# Patient Record
Sex: Female | Born: 1950
Health system: Southern US, Community
[De-identification: ages and names within clinical notes are randomized; demographics above are authoritative.]

## PROBLEM LIST (undated history)

## (undated) DIAGNOSIS — R921 Mammographic calcification found on diagnostic imaging of breast: Secondary | ICD-10-CM

## (undated) DIAGNOSIS — T7840XA Allergy, unspecified, initial encounter: Secondary | ICD-10-CM

## (undated) DIAGNOSIS — Z8489 Family history of other specified conditions: Secondary | ICD-10-CM

## (undated) DIAGNOSIS — J45909 Unspecified asthma, uncomplicated: Secondary | ICD-10-CM

## (undated) DIAGNOSIS — K760 Fatty (change of) liver, not elsewhere classified: Secondary | ICD-10-CM

## (undated) DIAGNOSIS — N941 Unspecified dyspareunia: Secondary | ICD-10-CM

## (undated) DIAGNOSIS — I209 Angina pectoris, unspecified: Secondary | ICD-10-CM

## (undated) DIAGNOSIS — H269 Unspecified cataract: Secondary | ICD-10-CM

## (undated) DIAGNOSIS — M1990 Unspecified osteoarthritis, unspecified site: Secondary | ICD-10-CM

## (undated) DIAGNOSIS — Z95820 Peripheral vascular angioplasty status with implants and grafts: Secondary | ICD-10-CM

## (undated) DIAGNOSIS — I2510 Atherosclerotic heart disease of native coronary artery without angina pectoris: Secondary | ICD-10-CM

## (undated) DIAGNOSIS — R011 Cardiac murmur, unspecified: Secondary | ICD-10-CM

## (undated) DIAGNOSIS — I10 Essential (primary) hypertension: Secondary | ICD-10-CM

## (undated) DIAGNOSIS — E785 Hyperlipidemia, unspecified: Secondary | ICD-10-CM

## (undated) DIAGNOSIS — T8859XA Other complications of anesthesia, initial encounter: Secondary | ICD-10-CM

## (undated) HISTORY — PX: FOOT SURGERY: SHX648

## (undated) HISTORY — PX: CARDIAC CATHETERIZATION: SHX172

## (undated) HISTORY — PX: BREAST EXCISIONAL BIOPSY: SUR124

---

## 1973-11-16 HISTORY — PX: ABDOMINAL HYSTERECTOMY: SHX81

## 1999-11-17 HISTORY — PX: BREAST LUMPECTOMY: SHX2

## 2000-02-23 ENCOUNTER — Encounter: Payer: Self-pay | Admitting: General Surgery

## 2000-02-23 ENCOUNTER — Ambulatory Visit (HOSPITAL_COMMUNITY): Admission: RE | Admit: 2000-02-23 | Discharge: 2000-02-23 | Payer: Self-pay | Admitting: General Surgery

## 2000-09-14 ENCOUNTER — Encounter: Payer: Self-pay | Admitting: General Surgery

## 2000-09-14 ENCOUNTER — Encounter: Admission: RE | Admit: 2000-09-14 | Discharge: 2000-09-14 | Payer: Self-pay | Admitting: General Surgery

## 2002-08-11 ENCOUNTER — Encounter: Admission: RE | Admit: 2002-08-11 | Discharge: 2002-08-11 | Payer: Self-pay | Admitting: General Surgery

## 2002-08-11 ENCOUNTER — Encounter: Payer: Self-pay | Admitting: General Surgery

## 2003-08-14 ENCOUNTER — Encounter: Payer: Self-pay | Admitting: General Surgery

## 2003-08-14 ENCOUNTER — Encounter: Admission: RE | Admit: 2003-08-14 | Discharge: 2003-08-14 | Payer: Self-pay | Admitting: General Surgery

## 2017-04-16 DIAGNOSIS — I2510 Atherosclerotic heart disease of native coronary artery without angina pectoris: Secondary | ICD-10-CM

## 2017-04-16 HISTORY — DX: Atherosclerotic heart disease of native coronary artery without angina pectoris: I25.10

## 2017-04-20 DIAGNOSIS — I2510 Atherosclerotic heart disease of native coronary artery without angina pectoris: Secondary | ICD-10-CM | POA: Insufficient documentation

## 2017-04-20 DIAGNOSIS — Z955 Presence of coronary angioplasty implant and graft: Secondary | ICD-10-CM | POA: Insufficient documentation

## 2017-04-20 HISTORY — PX: CORONARY ANGIOPLASTY WITH STENT PLACEMENT: SHX49

## 2017-04-20 HISTORY — PX: CARDIAC CATHETERIZATION: SHX172

## 2017-10-28 HISTORY — PX: OTHER SURGICAL HISTORY: SHX169

## 2018-11-16 HISTORY — PX: CATARACT EXTRACTION: SUR2

## 2021-05-27 DIAGNOSIS — E782 Mixed hyperlipidemia: Secondary | ICD-10-CM | POA: Insufficient documentation

## 2021-05-27 DIAGNOSIS — I10 Essential (primary) hypertension: Secondary | ICD-10-CM | POA: Insufficient documentation

## 2021-05-28 ENCOUNTER — Other Ambulatory Visit: Payer: Self-pay | Admitting: Internal Medicine

## 2021-05-28 DIAGNOSIS — Z1231 Encounter for screening mammogram for malignant neoplasm of breast: Secondary | ICD-10-CM

## 2021-05-28 DIAGNOSIS — R7989 Other specified abnormal findings of blood chemistry: Secondary | ICD-10-CM

## 2021-06-17 ENCOUNTER — Ambulatory Visit
Admission: RE | Admit: 2021-06-17 | Discharge: 2021-06-17 | Disposition: A | Discharge status: Home / Self Care | Payer: Managed Care, Other (non HMO) | Source: Ambulatory Visit | Attending: Internal Medicine | Admitting: Internal Medicine

## 2021-06-17 ENCOUNTER — Other Ambulatory Visit: Payer: Self-pay

## 2021-06-17 DIAGNOSIS — R7989 Other specified abnormal findings of blood chemistry: Secondary | ICD-10-CM | POA: Diagnosis not present

## 2021-06-25 ENCOUNTER — Other Ambulatory Visit: Payer: Self-pay

## 2021-06-25 ENCOUNTER — Encounter: Payer: Self-pay | Admitting: Physical Therapy

## 2021-06-25 ENCOUNTER — Ambulatory Visit: Payer: Medicare Other | Attending: Obstetrics and Gynecology | Admitting: Physical Therapy

## 2021-06-25 DIAGNOSIS — R102 Pelvic and perineal pain: Secondary | ICD-10-CM | POA: Diagnosis not present

## 2021-06-25 DIAGNOSIS — R293 Abnormal posture: Secondary | ICD-10-CM | POA: Diagnosis present

## 2021-06-25 DIAGNOSIS — R278 Other lack of coordination: Secondary | ICD-10-CM | POA: Diagnosis present

## 2021-06-25 NOTE — Therapy (Signed)
[facility] [address] [place], [place], [address] Phone: [phone]   Fax:  [phone]  Physical Therapy Evaluation  Patient Details  Name: Becky Mitchell MRN: [id number] Date of Birth: 1951-05-10 Referring Provider (PT): Christeen Douglas   Encounter Date: 06/25/2021   PT End of Session - 06/25/21 1822     Visit Number 1    Number of Visits 12    Date for PT Re-Evaluation 09/17/21    Authorization Type IE 06/25/2021    PT Start Time 1545    PT Stop Time 1625    PT Time Calculation (min) 40 min    Activity Tolerance Patient tolerated treatment well    Behavior During Therapy [facility] for tasks assessed/performed             History reviewed. No pertinent past medical history.  Past Surgical History:  Procedure Laterality Date   ABDOMINAL HYSTERECTOMY  1975   CARDIAC CATHETERIZATION  04/20/2017   spine cyst aspiration  10/28/2017    There were no vitals filed for this visit.     [facility] PT Assessment - 06/25/21 0001       Assessment   Medical Diagnosis Dyspareunia    Referring Provider (PT) Christeen Douglas    Hand Dominance Right      Balance Screen   Has the patient fallen in the past 6 months Yes    How many times? 1            PELVIC HEALTH PHYSICAL THERAPY EVALUATION  SCREENING Red Flags: None Have you had any night sweats? Unexplained weight loss? Saddle anesthesia? Unexplained changes in bowel or bladder habits?   SUBJECTIVE  Chief Complaint: Patient reports that she has not been able to participate in sex in years. Patient was told by GYN she had dyspareunia and was referred to dilator therapy. Patient notes that going through menopause she had continued tissue changes. Patient notes that she can count on 1 hand in 6 years how many times she has engaged in penetrative sex. All times have resulted in tearing and bleeding, always in the same spot (roughly 7 o'clock) which is very close to where the  episiotomy occurred. Patient notes that she has pain with penetration when an object is of equal to size 4 dilator.   Pertinent History:  Falls Positive.  Scoliosis Negative. Pulmonary disease/dysfunction Positive. Surgical history: Positive for see above.    Obstetrical History: G1P1 Deliveries: SVD Tearing/Episiotomy: episiotomy and tear; had repair surgery 25 years after which helped for a period of time.    Gynecological History: Hysterectomy: Yes Abdominal Endometriosis: Negative Pelvic Organ Prolapse: Negative Pain with exam: No Heaviness/pressure: No    Urinary History: Incontinence: Positive. Onset: since menopause. Triggers: sneezing, occasional cough. Only happens with full bladder. Amount: Min/Mod. Protective undergarments: No  Fluid Intake: 56+ oz H20, iced tea caffeinated, powerade zero Nocturia: 1-2x/night Frequency of urination: every 2-2.5 hours Pain with urination: Negative Difficulty initiating urination: Negative Intermittent stream: Positive for needs to tilt to one side to completely empty. Frequent UTI: Positive for historical.   Gastrointestinal History: [place] Stool Chart: Type 3 Frequency of BMs: 1x/day Pain with defecation: Negative Straining with defecation: Negative Hemorrhoids: Positive ; internal; latent  Sexual activity/pain: Pain with intercourse: Positive.   Initial penetration: Yes  Deep thrusting: Yes External stimulation: No Able to achieve orgasm: Yes  Location of pain: 7 o'clock episiotomy scar Current pain:  0/10  Max pain:  10/10 Least pain:  0/10 Pain quality: pain quality: excruciating, agonizing Radiating pain: No    Patient assessment of present state: I think scar tissue has built up over time between the original episiotomy and the repair.    OBJECTIVE  Mental Status Patient is oriented to person, place and time.  Recent memory is intact.  Remote memory is intact.  Attention span and concentration are  intact.  Expressive speech is intact.  Patient's fund of knowledge is within normal limits for educational level.  POSTURE/OBSERVATIONS:  Lumbar lordosis: decreased Thoracic kyphosis: increased Iliac crest height: L elevated  GAIT: Patient ambulates with significantly limited pelvic rotation, R Trendelenburg.   RANGE OF MOTION: deferred 2/2 to time constraints   LEFT RIGHT  Lumbar forward flexion (65):      Lumbar extension (30):     Lumbar lateral flexion (25):     Thoracic and Lumbar rotation (30 degrees):       Hip Flexion (0-125):      Hip IR (0-45):     Hip ER (0-45):     Hip Abduction (0-40):     Hip extension (0-15):       SENSATION: deferred 2/2 to time constraints  STRENGTH: MMT deferred 2/2 to time constraints  RLE LLE  Hip Flexion    Hip Extension    Hip Abduction     Hip Adduction     Hip ER     Hip IR     Knee Extension    Knee Flexion    Dorsiflexion     Plantarflexion (seated)     ABDOMINAL: deferred 2/2 to time constraints Palpation: Diastasis: Scar mobility: Rib flare:  SPECIAL TESTS: deferred 2/2 to time constraints   PHYSICAL PERFORMANCE MEASURES: STS:  Deep Squat: RLE STS: LLE STS:  : 5TSTS:    EXTERNAL PELVIC EXAM: deferred 2/2 to time constraints Palpation: Breath coordination: Voluntary Contraction: present/absent Relaxation: full/delayed/non-relaxing Perineal movement with sustained IAP increase ("bear down"): descent/no change/elevation/excessive descent Perineal movement with rapid IAP increase ("cough"): elevation/no change/descent  INTERNAL VAGINAL EXAM: deferred 2/2 to time constraints Introitus Appears:  Skin integrity:  Scar mobility: Strength (PERF):  Symmetry: Palpation: Prolapse:   INTERNAL RECTAL EXAM: not indicated Strength (PERF): Symmetry: Palpation: Prolapse:   OUTCOME MEASURES: FOTO (60)   ASSESSMENT Patient is a 70 year old presenting to clinic with chief complaints of pelvic pain.  Today's evaluation is suggestive of deficits in posture, pain, PFM coordination, PFM extensibility as evidenced by L elevated IC with R lateral trunk flexion, 10/10 pain with penetration, SUI with coughing/laughing. Patient's responses on FOTO Urinary outcome measures (60) indicate moderate functional limitations/disability/distress. Patient's progress may be limited due to persistence of complaint; however, patient's motivation is advantageous. Patient was able to achieve basic understanding of PFM functions during today's evaluation and responded positively to educational interventions. Patient will benefit from continued skilled therapeutic intervention to address deficits in posture, pain, PFM coordination, PFM extensibility in order to increase function and improve overall QOL.  EDUCATION Patient educated on prognosis, POC, and provided with HEP including: not initiated. Patient articulated understanding and returned demonstration. Patient will benefit from further education in order to maximize compliance and understanding for long-term therapeutic gains.  TREATMENT Neuromuscular Re-education: Patient educated on primary functions of the pelvic floor including: posture/balance, sexual pleasure, storage and elimination of waste from the body, abdominal cavity closure, and breath coordination.   Objective measurements completed on examination: See above findings.  PT Long Term Goals - 06/25/21 1842       PT LONG TERM GOAL #1   Title Patient will demonstrate independence with HEP in order to maximize therapeutic gains and improve carryover from physical therapy sessions to ADLs in the home and community.    Baseline IE: not initiated    Time 12    Period Weeks    Status New    Target Date 09/17/21      PT LONG TERM GOAL #2   Title Patient will demonstrate improved function as evidenced by a score of 68 on FOTO measure for full participation in activities  at home and in the community.    Baseline IE: 60    Time 12    Period Weeks    Status New    Target Date 09/17/21      PT LONG TERM GOAL #3   Title Patient will decrease worst pain as reported on NPRS by at least 2 points to demonstrate clinically significant reduction in pain in order to restore/improve function and overall QOL.    Baseline IE: 10/10 vaginal penetration    Time 12    Period Weeks    Status New    Target Date 09/17/21      PT LONG TERM GOAL #4   Title Patient will demonstrate coordinated lengthening and relaxation of PFM with diaphragmatic inhalation in order to decrease spasm and allow for unrestricted elimination of urine/feces for improved overall QOL.    Baseline IE: not demonstrated    Time 12    Period Weeks    Status New    Target Date 09/17/21                    Plan - 06/25/21 1823     Clinical Impression Statement Patient is a 70 year old presenting to clinic with chief complaints of pelvic pain. Today's evaluation is suggestive of deficits in posture, pain, PFM coordination, PFM extensibility as evidenced by L elevated IC with R lateral trunk flexion, 10/10 pain with penetration, SUI with coughing/laughing. Patient's responses on FOTO Urinary outcome measures (60) indicate moderate functional limitations/disability/distress. Patient's progress may be limited due to persistence of complaint; however, patient's motivation is advantageous. Patient was able to achieve basic understanding of PFM functions during today's evaluation and responded positively to educational interventions. Patient will benefit from continued skilled therapeutic intervention to address deficits in posture, pain, PFM coordination, PFM extensibility in order to increase function and improve overall QOL.    Personal Factors and Comorbidities Comorbidity 3+;Behavior Pattern;Time since onset of injury/illness/exacerbation;Past/Current Experience    Comorbidities asthma, s/p  hysterectomy (1976), CAD with stent placed 2018, essential HTN, s/p appendectomy, arthritis    Examination-Activity Limitations Other    Examination-Participation Restrictions Interpersonal Relationship    Stability/Clinical Decision Making Evolving/Moderate complexity    Clinical Decision Making Moderate    Rehab Potential Good    PT Frequency 1x / week    PT Duration 12 weeks    PT Treatment/Interventions Cryotherapy;Electrical Stimulation;Moist Heat;Therapeutic exercise;Neuromuscular re-education;Patient/family education;Orthotic Fit/Training;Manual techniques;Scar mobilization;Taping;Dry needling    PT Next Visit Plan physical assessment    PT Home Exercise Plan not initiated    Consulted and Agree with Plan of Care Patient             Patient will benefit from skilled therapeutic intervention in order to improve the following deficits and impairments:  Pain, Postural dysfunction, Improper body mechanics, Increased muscle spasms, Decreased strength, Decreased coordination,  Decreased activity tolerance, Increased fascial restricitons  Visit Diagnosis: Pelvic pain  Other lack of coordination  Abnormal posture     Problem List There are no problems to display for this patient.   Sheria Lang PT, DPT [phone]  06/25/2021, 6:44 PM  [facility] [address] [place], [place], [address] Phone: [phone]   Fax:  [phone]  Name: Becky Mitchell MRN: [id number] Date of Birth: September 12, 1951

## 2021-07-02 ENCOUNTER — Ambulatory Visit: Payer: Medicare Other | Admitting: Physical Therapy

## 2021-07-03 ENCOUNTER — Ambulatory Visit: Payer: Medicare Other | Admitting: Physical Therapy

## 2021-07-03 ENCOUNTER — Other Ambulatory Visit: Payer: Self-pay

## 2021-07-03 ENCOUNTER — Encounter: Payer: Self-pay | Admitting: Physical Therapy

## 2021-07-03 DIAGNOSIS — R278 Other lack of coordination: Secondary | ICD-10-CM

## 2021-07-03 DIAGNOSIS — R102 Pelvic and perineal pain: Secondary | ICD-10-CM | POA: Diagnosis not present

## 2021-07-03 DIAGNOSIS — R293 Abnormal posture: Secondary | ICD-10-CM

## 2021-07-03 NOTE — Therapy (Addendum)
[facility] [address] [place], [place], [address] Phone: [phone]   Fax:  [phone]  Physical Therapy Treatment  Patient Details  Name: Becky Mitchell MRN: [id number] Date of Birth: 06-Feb-1951 Referring Provider (PT): Christeen Douglas   Encounter Date: 07/03/2021   PT End of Session - 07/03/21 1438     Visit Number 2    Number of Visits 12    Date for PT Re-Evaluation 09/17/21    PT Start Time 1330    PT Stop Time 1415    PT Time Calculation (min) 45 min    Activity Tolerance Patient tolerated treatment well    Behavior During Therapy [facility] for tasks assessed/performed             History reviewed. No pertinent past medical history.  Past Surgical History:  Procedure Laterality Date   ABDOMINAL HYSTERECTOMY  1975   CARDIAC CATHETERIZATION  04/20/2017   spine cyst aspiration  10/28/2017    There were no vitals filed for this visit.   Subjective Assessment - 07/03/21 1327     Subjective Patient has no changes from last session and has began reading her book she received with the dilators purchased.    Currently in Pain? No/denies            TREATMENT Pre treatment Assessment:  POSTURE: IC elevation: negative Pelvic obliquity: negative  RANGE OF MOTION:    LEFT RIGHT  Lumbar forward flexion (65):  WFL    Lumbar extension (30): WFL    Lumbar lateral flexion (25):  [facility]  Thoracic and Lumbar rotation (30 degrees):    [facility] WFL  Hip Flexion (0-125):   [facility] WFL  Hip IR (0-45):  [facility] WFL  Hip ER (0-45):  [facility] WFL  Hip Abduction (0-40):  WFL WFL    SENSATION: Appears intact with light touch B  STRENGTH: MMT   RLE LLE  Hip Flexion 4 4  Hip Extension 4 4  Hip Abduction  4 4  Hip Adduction  4 4  Knee Extension 4 4  Knee Flexion 4 4  Dorsiflexion  4 4  Plantarflexion (seated) 4 4   ABDOMINAL:  Diastasis: less than 1 finger width superior to umbilicus, 2.5 finger width present at umbilicus and  inferior   SPECIAL TESTS:  FABER: negative FADDIR: positive, B  EXTERNAL PELVIC EXAM:  Breath coordination: present minimally, inconsistent Voluntary Contraction: present, 3/5 Relaxation: present, delayed Perineal movement with sustained IAP increase ("bear down"): elevation Perineal movement with rapid IAP increase ("cough"): no change  Neuromuscular Re-education: Supine diaphragmatic breathing with VCs and TCs for downregulation of the nervous system and management of IAP.   Patient educated on PFM lengthening, HEP, and what to expect during course of physical therapy.   Patient response: Patient verbalized difficulty coordinating breath with PFM lengthening but happily agreed to practice at home.   ASSESSMENT Patient presents to clinic with excellent motivation to participate in today's session. Patient demonstrates deficits in posture, pain, PFM coordination, PFM extensibility, and IAP management. During physical assessment, lumbar extension provoked pain and + FADDIR B. External pelvic assessment revealed ascending perineal movement with VC to "bear down." Patient educated on typical coordination of the PFM and responded positively. Patient will continue to benefit from skilled therapeutic interventions in order to address deficits of posture, pain, PFM coordination, PFM extensibility, and IAP management in order to return to function and improve QOL.    PT Long Term Goals - 06/25/21  1842       PT LONG TERM GOAL #1   Title Patient will demonstrate independence with HEP in order to maximize therapeutic gains and improve carryover from physical therapy sessions to ADLs in the home and community.    Baseline IE: not initiated    Time 12    Period Weeks    Status New    Target Date 09/17/21      PT LONG TERM GOAL #2   Title Patient will demonstrate improved function as evidenced by a score of 68 on FOTO measure for full participation in activities at home and in the community.     Baseline IE: 60    Time 12    Period Weeks    Status New    Target Date 09/17/21      PT LONG TERM GOAL #3   Title Patient will decrease worst pain as reported on NPRS by at least 2 points to demonstrate clinically significant reduction in pain in order to restore/improve function and overall QOL.    Baseline IE: 10/10 vaginal penetration    Time 12    Period Weeks    Status New    Target Date 09/17/21      PT LONG TERM GOAL #4   Title Patient will demonstrate coordinated lengthening and relaxation of PFM with diaphragmatic inhalation in order to decrease spasm and allow for unrestricted elimination of urine/feces for improved overall QOL.    Baseline IE: not demonstrated    Time 12    Period Weeks    Status New    Target Date 09/17/21                 Plan - 07/03/21 1321     Clinical Impression Statement Patient presents to clinic with excellent motivation to participate in today's session. Patient demonstrates deficits in posture, pain, PFM coordination, PFM extensibility, and IAP management. During physical assessment, lumbar extension provoked pain and + FADDIR B. External pelvic assessment revealed ascending perineal movement with VC to "bear down." Patient educated on typical coordination of the PFM and responded positively. Patient will continue to benefit from skilled therapeutic interventions in order to address deficits of posture, pain, PFM coordination, PFM extensibility, and IAP management in order to return to function and improve QOL.    Personal Factors and Comorbidities Comorbidity 3+;Behavior Pattern;Time since onset of injury/illness/exacerbation;Past/Current Experience    Comorbidities asthma, s/p hysterectomy (1976), CAD with stent placed 2018, essential HTN, s/p appendectomy, arthritis    Examination-Activity Limitations Other    Examination-Participation Restrictions Interpersonal Relationship    Stability/Clinical Decision Making Evolving/Moderate  complexity    Rehab Potential Good    PT Frequency 1x / week    PT Duration 12 weeks    PT Treatment/Interventions Cryotherapy;Electrical Stimulation;Moist Heat;Therapeutic exercise;Neuromuscular re-education;Patient/family education;Orthotic Fit/Training;Manual techniques;Scar mobilization;Taping;Dry needling    PT Next Visit Plan coordinated breath, PFM lengthening exercises    PT Home Exercise Plan Access Code: [email or web address]    Consulted and Agree with Plan of Care Patient             Patient will benefit from skilled therapeutic intervention in order to improve the following deficits and impairments:  Pain, Postural dysfunction, Improper body mechanics, Increased muscle spasms, Decreased strength, Decreased coordination, Decreased activity tolerance, Increased fascial restricitons  Visit Diagnosis: Pelvic pain  Other lack of coordination  Abnormal posture     Problem List There are no problems to display for this patient.  Gurnie Duris, SPT  This entire session was performed under direct supervision and direction of a licensed therapist/therapist assistant . I have personally read, edited and approve of the note as written.  Sheria Lang PT, DPT [phone]  07/03/2021, 3:34 PM  [place] [facility] [address] [place], [place], [address] Phone: [phone]   Fax:  [phone]  Name: Becky Mitchell MRN: [id number] Date of Birth: 06/24/1951

## 2021-07-10 ENCOUNTER — Ambulatory Visit: Payer: Medicare Other | Admitting: Physical Therapy

## 2021-07-10 ENCOUNTER — Other Ambulatory Visit: Payer: Self-pay

## 2021-07-10 ENCOUNTER — Encounter: Payer: Self-pay | Admitting: Physical Therapy

## 2021-07-10 DIAGNOSIS — R293 Abnormal posture: Secondary | ICD-10-CM

## 2021-07-10 DIAGNOSIS — R102 Pelvic and perineal pain: Secondary | ICD-10-CM

## 2021-07-10 DIAGNOSIS — R278 Other lack of coordination: Secondary | ICD-10-CM

## 2021-07-10 NOTE — Therapy (Signed)
[place] [facility] [address] [place], [place], [address] Phone: [phone]   Fax:  [phone]  Physical Therapy Treatment  Patient Details  Name: Becky Mitchell MRN: [id number] Date of Birth: 17-Apr-1951 Referring Provider (PT): Christeen Douglas   Encounter Date: 07/10/2021   PT End of Session - 07/10/21 1022     Visit Number 3    Number of Visits 12    Date for PT Re-Evaluation 09/17/21    Authorization Type IE 06/25/2021    PT Start Time 1027    PT Stop Time 1107    PT Time Calculation (min) 40 min    Activity Tolerance Patient tolerated treatment well    Behavior During Therapy [facility] for tasks assessed/performed             History reviewed. No pertinent past medical history.  Past Surgical History:  Procedure Laterality Date   ABDOMINAL HYSTERECTOMY  1975   CARDIAC CATHETERIZATION  04/20/2017   spine cyst aspiration  10/28/2017    There were no vitals filed for this visit.   Subjective Assessment - 07/10/21 1022     Subjective Patient has been able to perform breathing exercises since last session and has felt the PFM lengthening. Patient has not been able to use dilators quite yet.    Currently in Pain? No/denies            TREATMENT  Neuromuscular Re-education: Supine hooklying diaphragmatic breathing with VCs and TCs for downregulation of nervous system and IAP management   Supine PFM lengthening activities with visual cues on inhalation for improved perineal descent   Supine butterfly pose, verbal cues as needed   Supine alternating single knee to chest, verbal cues as needed   Supine "happy baby" pose, verbal cues as needed   Prone "childs pose," verbal cues as needed for shoulder and hip positioning and comfort  Patient educated throughout session on appropriate technique and form using multi-modal cueing, added activities to HEP, and activity modification. Patient verbalized understanding and returned  demonstration.  Patient Response to interventions: Patient reports difficulty concentrating on lengthening PFM in "happy baby" pose as it reminds her of sexual positions that cause pain. Patient agreeable to continue working on at home.   ASSESSMENT Patient presents to clinic with excellent motivation to participate in today's session. Patient demonstrates deficits in posture, pain, PFM coordination, PFM extensibility, and IAP management. Patient performed PFM lengthening activities with emphasis on inhalation in order to address deficit of perineal movement ascent with cue to "bear down." Patient able to achieve understanding of PFM extensibility and demonstrated use of a visual cue to assist with nervous system downtraining during today's session and responded positively to active interventions. Patient will benefit from continued skilled physical therapy to address remaining deficits in posture, pain, PFM coordination, PFM extensibility, and IAP management  in order to increase prior level of function and increase overall QOL.      PT Long Term Goals - 06/25/21 1842       PT LONG TERM GOAL #1   Title Patient will demonstrate independence with HEP in order to maximize therapeutic gains and improve carryover from physical therapy sessions to ADLs in the home and community.    Baseline IE: not initiated    Time 12    Period Weeks    Status New    Target Date 09/17/21      PT LONG TERM GOAL #2   Title Patient will demonstrate improved function  as evidenced by a score of 68 on FOTO measure for full participation in activities at home and in the community.    Baseline IE: 60    Time 12    Period Weeks    Status New    Target Date 09/17/21      PT LONG TERM GOAL #3   Title Patient will decrease worst pain as reported on NPRS by at least 2 points to demonstrate clinically significant reduction in pain in order to restore/improve function and overall QOL.    Baseline IE: 10/10 vaginal  penetration    Time 12    Period Weeks    Status New    Target Date 09/17/21      PT LONG TERM GOAL #4   Title Patient will demonstrate coordinated lengthening and relaxation of PFM with diaphragmatic inhalation in order to decrease spasm and allow for unrestricted elimination of urine/feces for improved overall QOL.    Baseline IE: not demonstrated    Time 12    Period Weeks    Status New    Target Date 09/17/21                   Plan - 07/10/21 1023     Clinical Impression Statement Patient presents to clinic with excellent motivation to participate in today's session. Patient demonstrates deficits in posture, pain, PFM coordination, PFM extensibility, and IAP management. Patient performed PFM lengthening activities with emphasis on inhalation in order to address deficit of perineal movement ascent with cue to "bear down." Patient able to achieve understanding of PFM extensibility and demonstrated use of a visual cue to assist with nervous system downtraining during today's session and responded positively to active interventions. Patient will benefit from continued skilled physical therapy to address remaining deficits in posture, pain, PFM coordination, PFM extensibility, and IAP management  in order to increase prior level of function and increase overall QOL.    Personal Factors and Comorbidities Comorbidity 3+;Behavior Pattern;Time since onset of injury/illness/exacerbation;Past/Current Experience    Comorbidities asthma, s/p hysterectomy (1976), CAD with stent placed 2018, essential HTN, s/p appendectomy, arthritis    Examination-Activity Limitations Other    Examination-Participation Restrictions Interpersonal Relationship    Stability/Clinical Decision Making Evolving/Moderate complexity    Rehab Potential Good    PT Frequency 1x / week    PT Duration 12 weeks    PT Treatment/Interventions Cryotherapy;Electrical Stimulation;Moist Heat;Therapeutic exercise;Neuromuscular  re-education;Patient/family education;Orthotic Fit/Training;Manual techniques;Scar mobilization;Taping;Dry needling    PT Next Visit Plan begin deep core activation with coordinated breath    PT Home Exercise Plan Access Code: [email or web address]    Consulted and Agree with Plan of Care Patient             Patient will benefit from skilled therapeutic intervention in order to improve the following deficits and impairments:  Pain, Postural dysfunction, Improper body mechanics, Increased muscle spasms, Decreased strength, Decreased coordination, Decreased activity tolerance, Increased fascial restricitons  Visit Diagnosis: Pelvic pain  Other lack of coordination  Abnormal posture     Problem List There are no problems to display for this patient.   Asael Pann, SPT  This entire session was performed under direct supervision and direction of a licensed Estate agent . I have personally read, edited and approve of the note as written.  Sheria Lang PT, DPT [phone]  07/10/2021, 12:34 PM  [place] [facility] [address] [place], [place], [address] Phone: [phone]   Fax:  [phone]  Name: Becky Mitchell MRN: [id number] Date of Birth: 03/15/1951

## 2021-07-16 ENCOUNTER — Encounter: Payer: Self-pay | Admitting: Physical Therapy

## 2021-07-17 ENCOUNTER — Encounter: Payer: Self-pay | Admitting: Physical Therapy

## 2021-07-17 ENCOUNTER — Other Ambulatory Visit: Payer: Self-pay

## 2021-07-17 ENCOUNTER — Ambulatory Visit: Payer: Medicare Other | Attending: Obstetrics and Gynecology | Admitting: Physical Therapy

## 2021-07-17 DIAGNOSIS — R278 Other lack of coordination: Secondary | ICD-10-CM | POA: Diagnosis present

## 2021-07-17 DIAGNOSIS — R102 Pelvic and perineal pain: Secondary | ICD-10-CM | POA: Insufficient documentation

## 2021-07-17 DIAGNOSIS — R293 Abnormal posture: Secondary | ICD-10-CM

## 2021-07-17 NOTE — Therapy (Signed)
[place] [facility] [address] [place], [place], [address] Phone: [phone]   Fax:  [phone]  Physical Therapy Treatment  Patient Details  Name: Becky Mitchell MRN: [id number] Date of Birth: 02-Jun-1951 Referring Provider (PT): Christeen Douglas   Encounter Date: 07/17/2021   PT End of Session - 07/17/21 1110     Visit Number 4    Number of Visits 12    Date for PT Re-Evaluation 09/17/21    Authorization Type IE 06/25/2021    PT Start Time 1115    PT Stop Time 1155    PT Time Calculation (min) 40 min    Activity Tolerance Patient tolerated treatment well    Behavior During Therapy [facility] for tasks assessed/performed             History reviewed. No pertinent past medical history.  Past Surgical History:  Procedure Laterality Date   ABDOMINAL HYSTERECTOMY  1975   CARDIAC CATHETERIZATION  04/20/2017   spine cyst aspiration  10/28/2017    There were no vitals filed for this visit.   Subjective Assessment - 07/17/21 1110     Subjective Patient has no significant changes since last session. Patient has been able to practice PFM lengthening exercises at home and been able to feel the tension release.    Currently in Pain? No/denies            TREATMENT  Pre-treatment assessment:  INTERNAL VAGINAL EXAM: Patient educated on the purpose of the pelvic exam and articulated understanding; patient consented to the exam verbally. Introitus Appears: WNL Skin integrity: slight erythema on labia majora, B Palpation: no TTP at bulbo or ischiocavernosus mm, no TTP at pubic symphysis; increased pain with initial DIP insertion at vaginal opening, tenderness at posterior fourchette, tenderness on R superficial transverse perineal mm  Neuromuscular Re-education: Supine hooklying diaphragmatic breathing before and during assessment with VCs and TCs for downregulation of nervous system and IAP management  Patient educated on pain modulation  via ice/heat after manual techniques.  Manual Therapy: Supine hooklying MFR techniques at posterior fourchette and surrounding connective tissue at superficial transverse, bulbocavernous, and ischiocavernosus mm to improve tissue extensibility and decrease tension at the pelvic floor.    Patient educated throughout session on appropriate technique and form using multi-modal cueing, added activities to HEP, and activity modification. Patient verbalized understanding and returned demonstration.  Patient Response to Interventions Patient with sensitivity at the vaginal opening.   ASSESSMENT Patient presents to clinic with excellent motivation to participate in today's session. Patient demonstrates deficits in posture, pain, PFM coordination, PFM extensibility, and IAP management. PFM internal assessment revealed increased pain at vaginal opening with DIP insertion. Patient unable to tolerate further internal assessment and treatment focused on MFR techniques at posterior fourchette and surrounding connective tissue to ease pain and promote tissue extensibility. Patient performed excellent diaphragmatic breathing techniques during today's session and responded positively to manual interventions. Patient will benefit from continued skilled physical therapy to address remaining deficits in posture, pain, PFM coordination, PFM extensibility, and IAP management  in order to increase prior level of function and increase overall QOL.      PT Long Term Goals - 06/25/21 1842       PT LONG TERM GOAL #1   Title Patient will demonstrate independence with HEP in order to maximize therapeutic gains and improve carryover from physical therapy sessions to ADLs in the home and community.    Baseline IE: not initiated    Time  12    Period Weeks    Status New    Target Date 09/17/21      PT LONG TERM GOAL #2   Title Patient will demonstrate improved function as evidenced by a score of 68 on FOTO measure for  full participation in activities at home and in the community.    Baseline IE: 60    Time 12    Period Weeks    Status New    Target Date 09/17/21      PT LONG TERM GOAL #3   Title Patient will decrease worst pain as reported on NPRS by at least 2 points to demonstrate clinically significant reduction in pain in order to restore/improve function and overall QOL.    Baseline IE: 10/10 vaginal penetration    Time 12    Period Weeks    Status New    Target Date 09/17/21      PT LONG TERM GOAL #4   Title Patient will demonstrate coordinated lengthening and relaxation of PFM with diaphragmatic inhalation in order to decrease spasm and allow for unrestricted elimination of urine/feces for improved overall QOL.    Baseline IE: not demonstrated    Time 12    Period Weeks    Status New    Target Date 09/17/21                   Plan - 07/17/21 1111     Clinical Impression Statement Patient presents to clinic with excellent motivation to participate in today's session. Patient demonstrates deficits in posture, pain, PFM coordination, PFM extensibility, and IAP management. PFM internal assessment revealed increased pain at vaginal opening with DIP insertion. Patient unable to tolerate further internal assessment and treatment focused on MFR techniques at posterior fourchette and surrounding connective tissue to ease pain and promote tissue extensibility. Patient performed excellent diaphragmatic breathing techniques during today's session and responded positively to manual interventions. Patient will benefit from continued skilled physical therapy to address remaining deficits in posture, pain, PFM coordination, PFM extensibility, and IAP management  in order to increase prior level of function and increase overall QOL.    Personal Factors and Comorbidities Comorbidity 3+;Behavior Pattern;Time since onset of injury/illness/exacerbation;Past/Current Experience    Comorbidities asthma, s/p  hysterectomy (1976), CAD with stent placed 2018, essential HTN, s/p appendectomy, arthritis    Examination-Activity Limitations Other    Examination-Participation Restrictions Interpersonal Relationship    Stability/Clinical Decision Making Evolving/Moderate complexity    Rehab Potential Good    PT Frequency 1x / week    PT Duration 12 weeks    PT Treatment/Interventions Cryotherapy;Electrical Stimulation;Moist Heat;Therapeutic exercise;Neuromuscular re-education;Patient/family education;Orthotic Fit/Training;Manual techniques;Scar mobilization;Taping;Dry needling    PT Next Visit Plan MFR of PFM, scar mobilization    PT Home Exercise Plan Access Code: [email or web address]    Consulted and Agree with Plan of Care Patient             Patient will benefit from skilled therapeutic intervention in order to improve the following deficits and impairments:  Pain, Postural dysfunction, Improper body mechanics, Increased muscle spasms, Decreased strength, Decreased coordination, Decreased activity tolerance, Increased fascial restricitons  Visit Diagnosis: Pelvic pain  Other lack of coordination  Abnormal posture     Problem List There are no problems to display for this patient.   Verlene Glantz, SPT  This entire session was performed under direct supervision and direction of a licensed Estate agent . I have personally read, edited and approve of the note as  written. Sheria Lang PT, DPT [phone]  07/17/2021, 1:41 PM  [facility] [address] [place], [place], [address] Phone: [phone]   Fax:  [phone]  Name: Becky Mitchell MRN: [id number] Date of Birth: Oct 19, 1951

## 2021-07-23 ENCOUNTER — Encounter: Payer: Self-pay | Admitting: Physical Therapy

## 2021-07-24 ENCOUNTER — Encounter: Payer: Self-pay | Admitting: Physical Therapy

## 2021-07-24 ENCOUNTER — Ambulatory Visit: Payer: Medicare Other | Admitting: Physical Therapy

## 2021-07-24 ENCOUNTER — Other Ambulatory Visit: Payer: Self-pay

## 2021-07-24 DIAGNOSIS — R278 Other lack of coordination: Secondary | ICD-10-CM

## 2021-07-24 DIAGNOSIS — R293 Abnormal posture: Secondary | ICD-10-CM

## 2021-07-24 DIAGNOSIS — R102 Pelvic and perineal pain: Secondary | ICD-10-CM

## 2021-07-24 NOTE — Therapy (Signed)
[place] [facility] [address] [place], [place], [address] Phone: [phone]   Fax:  [phone]  Physical Therapy Treatment  Patient Details  Name: Becky Mitchell MRN: [id number] Date of Birth: 25-Jun-1951 Referring Provider (PT): Benjaman Kindler   Encounter Date: 07/24/2021   PT End of Session - 07/24/21 1321     Visit Number 5    Number of Visits 12    Date for PT Re-Evaluation 09/17/21    Authorization Type IE 06/25/2021    PT Start Time 1325    PT Stop Time 1410    PT Time Calculation (min) 45 min    Activity Tolerance Patient tolerated treatment well    Behavior During Therapy [facility] for tasks assessed/performed             History reviewed. No pertinent past medical history.  Past Surgical History:  Procedure Laterality Date   ABDOMINAL HYSTERECTOMY  1975   CARDIAC CATHETERIZATION  04/20/2017   spine cyst aspiration  10/28/2017    There were no vitals filed for this visit.   Subjective Assessment - 07/24/21 1321     Subjective Patient is a bit moe fatigued today as her dog kept her up last night. Patient states trying a size 2 in her dilator kit and held it in the vaginal canal for 8 mins. Patient began to feel the "cramping" of her muscles but was able to incorporate diaphragmatic breathing with the dilator inserted. Patient did not have the feeling of tearing with the dilator that she usually feels with penetrative sex. Patient had no soreness from manual interventions last session.    Currently in Pain? No/denies            TREATMENT  Manual Therapy: Scar mobilization at abdominal hysterectomy scar: parallel and perpendicular motions to improve tissue extensibility and decrease tension at scar site.   Neuromuscular Re-education: Supine hooklying diaphragmatic breathing with VCs and TCs for downregulation of nervous system and IAP management   Supine PFM lengthening activities at wall with pillow for increased  tactile feedback   Supine butterfly pose with coordinated breath, VCs and TCs as needed   Supine hamstring stretch with coordinated breath, VCs and TCs as needed  Supine adductor "V" stretch with coordinated breath, VCs and TCs as needed  Supine "happy baby" pose with one leg straight and coordinated breath, VCs and TCs as needed  Supine "happy baby" pose with coordinated breath, VCs and TCs as needed   Patient educated on dilator use and prescription of introducing dilator insertion gently at the vagina. Patient also educated throughout session on appropriate technique and form using multi-modal cueing, added activities to HEP, and activity modification. Patient verbalized understanding and returned demonstration.  Patient Response to interventions: Patient notes it is easier for her body to relax and perform stretches in clinic than at home  ASSESSMENT Patient presents to clinic with excellent motivation to participate in today's session. Patient demonstrates deficits in posture, pain, PFM coordination, PFM extensibility, and scar mobility. Patient with increased tenderness, shelfing, and minimal erythema at abdominal hysterectomy scar with manual therapy performed at the deeper tissues in a parallel and perpendicular fashion. Patient performed PFM lengthening activities at the wall for increased feedback and increased stretch of LE musculature. Patient demonstrated improved diaphragmatic breathing in supine and responded positively to manual, active, and educational interventions. Patient able to achieve understanding of self scar massage and dilator use during today's session and will benefit from continued skilled physical  therapy to address remaining deficits in posture, pain, PFM coordination, PFM extensibility, and scar mobility in order to increase prior level of function and increase overall QOL.      PT Long Term Goals - 06/25/21 1842       PT LONG TERM GOAL #1   Title Patient will  demonstrate independence with HEP in order to maximize therapeutic gains and improve carryover from physical therapy sessions to ADLs in the home and community.    Baseline IE: not initiated    Time 12    Period Weeks    Status New    Target Date 09/17/21      PT LONG TERM GOAL #2   Title Patient will demonstrate improved function as evidenced by a score of 68 on FOTO measure for full participation in activities at home and in the community.    Baseline IE: 60    Time 12    Period Weeks    Status New    Target Date 09/17/21      PT LONG TERM GOAL #3   Title Patient will decrease worst pain as reported on NPRS by at least 2 points to demonstrate clinically significant reduction in pain in order to restore/improve function and overall QOL.    Baseline IE: 10/10 vaginal penetration    Time 12    Period Weeks    Status New    Target Date 09/17/21      PT LONG TERM GOAL #4   Title Patient will demonstrate coordinated lengthening and relaxation of PFM with diaphragmatic inhalation in order to decrease spasm and allow for unrestricted elimination of urine/feces for improved overall QOL.    Baseline IE: not demonstrated    Time 12    Period Weeks    Status New    Target Date 09/17/21                   Plan - 07/24/21 1321     Clinical Impression Statement Patient presents to clinic with excellent motivation to participate in today's session. Patient demonstrates deficits in posture, pain, PFM coordination, PFM extensibility, and scar mobility. Patient with increased tenderness, shelfing, and minimal erythema at abdominal hysterectomy scar with manual therapy performed at the deeper tissues in a parallel and perpendicular fashion. Patient performed PFM lengthening activities at the wall for increased feedback and increased stretch of LE musculature. Patient demonstrated improved diaphragmatic breathing in supine and responded positively to manual, active, and educational  interventions. Patient able to achieve understanding of self scar massage and dilator use during today's session and will benefit from continued skilled physical therapy to address remaining deficits in posture, pain, PFM coordination, PFM extensibility, and scar mobility in order to increase prior level of function and increase overall QOL.    Personal Factors and Comorbidities Comorbidity 3+;Behavior Pattern;Time since onset of injury/illness/exacerbation;Past/Current Experience    Comorbidities asthma, s/p hysterectomy (1976), CAD with stent placed 2018, essential HTN, s/p appendectomy, arthritis    Examination-Activity Limitations Other    Examination-Participation Restrictions Interpersonal Relationship    Stability/Clinical Decision Making Evolving/Moderate complexity    Rehab Potential Good    PT Frequency 1x / week    PT Duration 12 weeks    PT Treatment/Interventions Cryotherapy;Electrical Stimulation;Moist Heat;Therapeutic exercise;Neuromuscular re-education;Patient/family education;Orthotic Fit/Training;Manual techniques;Scar mobilization;Taping;Dry needling    PT Next Visit Plan MFR of PFM, scar mobilization    PT Home Exercise Plan Access Code: [email or web address]    Consulted and Agree with Plan of Care Patient  Patient will benefit from skilled therapeutic intervention in order to improve the following deficits and impairments:  Pain, Postural dysfunction, Improper body mechanics, Increased muscle spasms, Decreased strength, Decreased coordination, Decreased activity tolerance, Increased fascial restricitons  Visit Diagnosis: Pelvic pain  Other lack of coordination  Abnormal posture     Problem List There are no problems to display for this patient.  Forrest Demuro, SPT  This entire session was performed under direct supervision and direction of a licensed Chiropractor . I have personally read, edited and approve of the note as written.  Myles Gip PT, DPT [phone]  07/24/2021, 2:40 PM  [place] [facility] [address] [place], [place], [address] Phone: [phone]   Fax:  [phone]  Name: Becky Mitchell MRN: [id number] Date of Birth: 01/21/1951

## 2021-07-30 ENCOUNTER — Encounter: Payer: Self-pay | Admitting: Physical Therapy

## 2021-07-31 ENCOUNTER — Other Ambulatory Visit: Payer: Self-pay

## 2021-07-31 ENCOUNTER — Encounter: Payer: Self-pay | Admitting: Physical Therapy

## 2021-07-31 ENCOUNTER — Ambulatory Visit: Payer: Medicare Other | Admitting: Physical Therapy

## 2021-07-31 DIAGNOSIS — R278 Other lack of coordination: Secondary | ICD-10-CM

## 2021-07-31 DIAGNOSIS — R102 Pelvic and perineal pain: Secondary | ICD-10-CM

## 2021-07-31 DIAGNOSIS — R293 Abnormal posture: Secondary | ICD-10-CM

## 2021-07-31 NOTE — Therapy (Signed)
[facility] [facility] [address] [place], [place], [address] Phone: [phone]   Fax:  [phone]  Patient Details  Name: Becky Mitchell MRN: [id number] Date of Birth: 1951-08-15 Referring Provider:  Christeen Douglas, MD  Encounter Date: 07/31/2021   Subjective Assessment - 07/31/21 1329     Subjective Patient with increased stress due to family events over the past week and having to make end-of-life decisions for her pet.    Currently in Pain? No/denies            Patient and PT agreed to defer treatment session today due to increased emotional lability and stress. Patient comfortable to follow-up at next appointment.     Marke Goodwyn,SPT This entire session was performed under direct supervision and direction of a licensed Estate agent . I have personally read, edited and approve of the note as written.   Sheria Lang PT, DPT [phone]  07/31/2021, 1:50 PM  [facility] [address] [place], [place], [address] Phone: [phone]   Fax:  [phone]

## 2021-08-06 ENCOUNTER — Encounter: Payer: Self-pay | Admitting: Physical Therapy

## 2021-08-07 ENCOUNTER — Encounter: Payer: Self-pay | Admitting: Physical Therapy

## 2021-08-07 ENCOUNTER — Other Ambulatory Visit: Payer: Self-pay

## 2021-08-07 ENCOUNTER — Ambulatory Visit: Payer: Medicare Other | Admitting: Physical Therapy

## 2021-08-07 DIAGNOSIS — R293 Abnormal posture: Secondary | ICD-10-CM

## 2021-08-07 DIAGNOSIS — R102 Pelvic and perineal pain: Secondary | ICD-10-CM

## 2021-08-07 DIAGNOSIS — R278 Other lack of coordination: Secondary | ICD-10-CM

## 2021-08-07 NOTE — Therapy (Signed)
[facility] [address] [place], [place], [address] Phone: [phone]   Fax:  [phone]  Physical Therapy Treatment  Patient Details  Name: Becky Mitchell MRN: [id number] Date of Birth: Oct 10, 1951 Referring Provider (PT): Christeen Douglas   Encounter Date: 08/07/2021   PT End of Session - 08/07/21 1329     Visit Number 7    Number of Visits 12    Date for PT Re-Evaluation 09/17/21    Authorization Type IE 06/25/2021    PT Start Time 1330    PT Stop Time 1410    PT Time Calculation (min) 40 min    Activity Tolerance Patient tolerated treatment well    Behavior During Therapy [facility] for tasks assessed/performed             History reviewed. No pertinent past medical history.  Past Surgical History:  Procedure Laterality Date   ABDOMINAL HYSTERECTOMY  1975   CARDIAC CATHETERIZATION  04/20/2017   spine cyst aspiration  10/28/2017    There were no vitals filed for this visit.   Subjective Assessment - 08/07/21 1328     Subjective Patient reports doing better than last week but is in a greiving process right now. Patient has not had the concentration to perform her HEP.    Currently in Pain? No/denies            TREATMENT  Neuromuscular Re-education: Supine hooklying diaphragmatic breathing with VCs and TCs for downregulation of nervous system and IAP management   PFM lengthening activities with VCs and coordinated breath for decreased tension at the pelvic floor   Supine butterfly pose  Supine alternating single knee to chest  Supine "happy baby" pose  Prone "childs pose," center, L and R lateral movement for improve lengthening at lateral musculature  Cat and cow pose with coordinated breath   Prone extension with emphasis on coordinated inhale   Patient educated on the impact shoewear, heels in particular, can have on increasing tension at the pelvic floor due to the sacral nerve roots being activated during  a heel raise. Patient verbalized understanding and will consider what footwear to use based on the information given.   Patient also educated throughout session on appropriate technique and form using multi-modal cueing, added activities to HEP, and activity modification. Patient verbalized understanding and returned demonstration.  Patient Response to interventions: Patient states she will not make any promises about changing footwear as she has worn heels for a very long time, but she has the information and will take it into consideration.   ASSESSMENT Patient presents to clinic with excellent motivation to participate in today's session. Patient demonstrates deficits in posture, pain, PFM coordination, PFM extensibility, and IAP management. Patient performed PFM lengthening interventions with emphasis on inhalation in order to achieve maximum perineal descent. Patient participated in a discussion about footwear that can cause increased tension at the pelvic floor during today's session and responded positively to active and educational interventions. Patient will benefit from continued skilled physical therapy to address remaining deficits in posture, pain, PFM coordination, PFM extensibility, and IAP management  in order to increase prior level of function and increase overall QOL.      PT Long Term Goals - 06/25/21 1842       PT LONG TERM GOAL #1   Title Patient will demonstrate independence with HEP in order to maximize therapeutic gains and improve carryover from physical therapy sessions to ADLs in the home and community.  Baseline IE: not initiated    Time 12    Period Weeks    Status New    Target Date 09/17/21      PT LONG TERM GOAL #2   Title Patient will demonstrate improved function as evidenced by a score of 68 on FOTO measure for full participation in activities at home and in the community.    Baseline IE: 60    Time 12    Period Weeks    Status New    Target Date  09/17/21      PT LONG TERM GOAL #3   Title Patient will decrease worst pain as reported on NPRS by at least 2 points to demonstrate clinically significant reduction in pain in order to restore/improve function and overall QOL.    Baseline IE: 10/10 vaginal penetration    Time 12    Period Weeks    Status New    Target Date 09/17/21      PT LONG TERM GOAL #4   Title Patient will demonstrate coordinated lengthening and relaxation of PFM with diaphragmatic inhalation in order to decrease spasm and allow for unrestricted elimination of urine/feces for improved overall QOL.    Baseline IE: not demonstrated    Time 12    Period Weeks    Status New    Target Date 09/17/21                   Plan - 08/07/21 1329     Clinical Impression Statement Patient presents to clinic with excellent motivation to participate in today's session. Patient demonstrates deficits in posture, pain, PFM coordination, PFM extensibility, and IAP management. Patient performed PFM lengthening interventions with emphasis on inhalation in order to achieve maximum perineal descent. Patient participated in a discussion about footwear that can cause increased tension at the pelvic floor during today's session and responded positively to active and educational interventions. Patient will benefit from continued skilled physical therapy to address remaining deficits in posture, pain, PFM coordination, PFM extensibility, and IAP management  in order to increase prior level of function and increase overall QOL.    Personal Factors and Comorbidities Comorbidity 3+;Behavior Pattern;Time since onset of injury/illness/exacerbation;Past/Current Experience    Comorbidities asthma, s/p hysterectomy (1976), CAD with stent placed 2018, essential HTN, s/p appendectomy, arthritis    Examination-Activity Limitations Other    Examination-Participation Restrictions Interpersonal Relationship    Stability/Clinical Decision Making  Evolving/Moderate complexity    Rehab Potential Good    PT Frequency 1x / week    PT Duration 12 weeks    PT Treatment/Interventions Cryotherapy;Electrical Stimulation;Moist Heat;Therapeutic exercise;Neuromuscular re-education;Patient/family education;Orthotic Fit/Training;Manual techniques;Scar mobilization;Taping;Dry needling    PT Next Visit Plan MFR of PFM, scar mobilization    PT Home Exercise Plan --    Consulted and Agree with Plan of Care Patient             Patient will benefit from skilled therapeutic intervention in order to improve the following deficits and impairments:  Pain, Postural dysfunction, Improper body mechanics, Increased muscle spasms, Decreased strength, Decreased coordination, Decreased activity tolerance, Increased fascial restricitons  Visit Diagnosis: Pelvic pain  Abnormal posture  Other lack of coordination     Problem List There are no problems to display for this patient.  Zayneb Baucum, SPT  This entire session was performed under direct supervision and direction of a licensed Estate agent . I have personally read, edited and approve of the note as written. Sheria Lang PT, DPT [phone]  08/07/2021, 3:35 PM  [place] [facility] [address] [place], [place], [address] Phone: [phone]   Fax:  [phone]  Name: Becky Mitchell MRN: [id number] Date of Birth: 19-May-1951

## 2021-08-13 ENCOUNTER — Encounter: Payer: Self-pay | Admitting: Physical Therapy

## 2021-08-14 ENCOUNTER — Ambulatory Visit: Payer: Medicare Other | Admitting: Physical Therapy

## 2021-08-14 ENCOUNTER — Encounter: Payer: Self-pay | Admitting: Physical Therapy

## 2021-08-14 ENCOUNTER — Other Ambulatory Visit: Payer: Self-pay

## 2021-08-14 DIAGNOSIS — R102 Pelvic and perineal pain: Secondary | ICD-10-CM

## 2021-08-14 DIAGNOSIS — R293 Abnormal posture: Secondary | ICD-10-CM

## 2021-08-14 DIAGNOSIS — R278 Other lack of coordination: Secondary | ICD-10-CM

## 2021-08-14 NOTE — Therapy (Signed)
[facility] [address] [place], [place], [address] Phone: [phone]   Fax:  [phone]  Physical Therapy Treatment  Patient Details  Name: Becky Mitchell MRN: [id number] Date of Birth: Dec 26, 1950 Referring Provider (PT): Christeen Douglas   Encounter Date: 08/14/2021   PT End of Session - 08/14/21 1328     Visit Number 8    Number of Visits 12    Date for PT Re-Evaluation 09/17/21    Authorization Type IE 06/25/2021    PT Start Time 1330    PT Stop Time 1413    PT Time Calculation (min) 43 min    Activity Tolerance Patient tolerated treatment well    Behavior During Therapy [facility] for tasks assessed/performed             History reviewed. No pertinent past medical history.  Past Surgical History:  Procedure Laterality Date   ABDOMINAL HYSTERECTOMY  1975   CARDIAC CATHETERIZATION  04/20/2017   spine cyst aspiration  10/28/2017    There were no vitals filed for this visit.   Subjective Assessment - 08/14/21 1328     Subjective Patient presents to clinic wearing sneakers today. Patient states she wanted to do that for "[place]" but has decided to also mix-up her shoe wear with sneakers, 2in, and 4in heels. Patient also noted her commode is taller than normal as she sits with heels raised.    Currently in Pain? No/denies              TREATMENT Manual Therapy: Patient educated on posterior fourchette mobilization to desensitize tissues at and around the vaginal opening. Patient given instructions on progression of manual technique and pain modulation via lidocaine lubricant. Patient also educated on tissue extensibility properties such as keeping the area of irritation moist via coconut oil, sweet almond oil, or jojoba oil.   Neuromuscular Re-education: Patient discussion on introducing dilator training gradually with at-home dilator use and lubricant. Patient instructed to use diaphragmatic breathing for nervous system  downregulation during introduction of dilators before insertion. Patient reminded that PFM lengthening positions such as single knee to chest or butterfly pose may be beneficial as patient has increased pain with movement during penetrative sex.     Patient verbalized understand and returned demonstration throughout session.   Patient Response to interventions: Patient with some hesitation with the next steps of direct manual techniques but is willing to try. Patient comfortable to return in a few weeks to give time for practice.   ASSESSMENT Patient presents to clinic with excellent motivation to participate in today's session. Patient demonstrates deficits in posture, pain, PFM coordination, PFM extensibility, and IAP management. Patient thoroughly educated on returning to dilator training, participating in posterior fourchette manual techniques, and use of oils at the scar site internally to promote tissue pliability while also desensitizing the tissues. Patient verbalized understanding and will be returning in a few weeks to give her enough time to explore manual interventions discussed. Patient will benefit from continued skilled physical therapy to address remaining deficits in posture, pain, PFM coordination, PFM extensibility, and IAP management  in order to increase prior level of function and increase overall QOL.       PT Long Term Goals - 08/14/21 1351       PT LONG TERM GOAL #1   Title Patient will demonstrate independence with HEP in order to maximize therapeutic gains and improve carryover from physical therapy sessions to ADLs in the home and community.  Baseline IE: not initiated    Time 12    Period Weeks    Status On-going    Target Date 09/17/21      PT LONG TERM GOAL #2   Title Patient will demonstrate improved function as evidenced by a score of 68 on FOTO measure for full participation in activities at home and in the community.    Baseline IE: 60; 9/29: 62     Time 12    Period Weeks    Status On-going    Target Date 09/17/21      PT LONG TERM GOAL #3   Title Patient will decrease worst pain as reported on NPRS by at least 2 points to demonstrate clinically significant reduction in pain in order to restore/improve function and overall QOL.    Baseline IE: 10/10 vaginal penetration    Time 12    Period Weeks    Status On-going    Target Date 09/17/21      PT LONG TERM GOAL #4   Title Patient will demonstrate coordinated lengthening and relaxation of PFM with diaphragmatic inhalation in order to decrease spasm and allow for unrestricted elimination of urine/feces for improved overall QOL.    Baseline IE: not demonstrated    Time 12    Period Weeks    Status On-going    Target Date 09/17/21                   Plan - 08/14/21 1328     Clinical Impression Statement Patient presents to clinic with excellent motivation to participate in today's session. Patient demonstrates deficits in posture, pain, PFM coordination, PFM extensibility, and IAP management. Patient thoroughly educated on returning to dilator training, participating in posterior fourchette manual techniques, and use of oils at the scar site internally to promote tissue pliability while also desensitizing the tissues. Patient verbalized understanding and will be returning in a few weeks to give her enough time to explore manual interventions discussed. Patient will benefit from continued skilled physical therapy to address remaining deficits in posture, pain, PFM coordination, PFM extensibility, and IAP management  in order to increase prior level of function and increase overall QOL.    Personal Factors and Comorbidities Comorbidity 3+;Behavior Pattern;Time since onset of injury/illness/exacerbation;Past/Current Experience    Comorbidities asthma, s/p hysterectomy (1976), CAD with stent placed 2018, essential HTN, s/p appendectomy, arthritis    Examination-Activity Limitations  Other    Examination-Participation Restrictions Interpersonal Relationship    Stability/Clinical Decision Making Evolving/Moderate complexity    Rehab Potential Good    PT Frequency 1x / week    PT Duration 12 weeks    PT Treatment/Interventions Cryotherapy;Electrical Stimulation;Moist Heat;Therapeutic exercise;Neuromuscular re-education;Patient/family education;Orthotic Fit/Training;Manual techniques;Scar mobilization;Taping;Dry needling    PT Next Visit Plan revisit from last session discussion    PT Home Exercise Plan posterior fourchette manual technique handout    Consulted and Agree with Plan of Care Patient             Patient will benefit from skilled therapeutic intervention in order to improve the following deficits and impairments:  Pain, Postural dysfunction, Improper body mechanics, Increased muscle spasms, Decreased strength, Decreased coordination, Decreased activity tolerance, Increased fascial restricitons  Visit Diagnosis: Pelvic pain  Abnormal posture  Other lack of coordination     Problem List There are no problems to display for this patient.   Daniela Siebers, SPT  This entire session was performed under direct supervision and direction of a licensed Estate agent .  I have personally read, edited and approve of the note as written.  Sheria Lang PT, DPT [phone]  08/14/2021, 2:37 PM  [facility] [address] [place], [place], [address] Phone: [phone]   Fax:  [phone]  Name: Rosezetta Balderston MRN: [id number] Date of Birth: 03-27-1951

## 2021-08-21 ENCOUNTER — Ambulatory Visit: Payer: Medicare Other | Admitting: Physical Therapy

## 2021-08-26 ENCOUNTER — Ambulatory Visit: Payer: Medicare Other | Attending: Obstetrics and Gynecology | Admitting: Physical Therapy

## 2021-08-26 ENCOUNTER — Encounter: Payer: Self-pay | Admitting: Physical Therapy

## 2021-08-26 ENCOUNTER — Other Ambulatory Visit: Payer: Self-pay

## 2021-08-26 DIAGNOSIS — R293 Abnormal posture: Secondary | ICD-10-CM | POA: Diagnosis present

## 2021-08-26 DIAGNOSIS — R102 Pelvic and perineal pain: Secondary | ICD-10-CM | POA: Diagnosis not present

## 2021-08-26 DIAGNOSIS — R278 Other lack of coordination: Secondary | ICD-10-CM | POA: Insufficient documentation

## 2021-08-26 NOTE — Therapy (Signed)
[facility] [address] [place], [place], [address] Phone: [phone]   Fax:  [phone]  Physical Therapy Treatment  Patient Details  Name: Becky Mitchell MRN: [id number] Date of Birth: 02/16/1951 Referring Provider (PT): Christeen Douglas   Encounter Date: 08/26/2021   PT End of Session - 08/26/21 1114     Visit Number 9    Number of Visits 12    Date for PT Re-Evaluation 09/17/21    Authorization Type IE 06/25/2021    PT Start Time 1115    PT Stop Time 1200    PT Time Calculation (min) 45 min    Activity Tolerance Patient tolerated treatment well    Behavior During Therapy [facility] for tasks assessed/performed             History reviewed. No pertinent past medical history.  Past Surgical History:  Procedure Laterality Date   ABDOMINAL HYSTERECTOMY  1975   CARDIAC CATHETERIZATION  04/20/2017   spine cyst aspiration  10/28/2017    There were no vitals filed for this visit.   Subjective Assessment - 08/26/21 1114     Subjective Patient reports performing posterior fourchette technique at home and the first time she was a bit too aggressive. Patient had to take a couple days to recover because the area was tender and rested at a 5/10 pain. Patient also felt that the postioning of her thumb felt odd when she was trying to perform technique. SPT discussed with patient that an option at a future visit can involve her partner as both are comfortable and consent. Patient also began using cocount oil and has been applying the oil every day around the episiotomy scar as directly applying it causes tenderness.    Currently in Pain? No/denies            TREATMENT  Neuromuscular Re-education: Supine hooklying diaphragmatic breathing for downregulation of the nervous system and to decrease PFM tension before, during, and after PFM internal techniques  Manual Therapy: Patient consented to performing PFM internal techniques    Posterior fourchette desensitization at 6 o'clock with coordinated breathing to decrease pressure felt at the vaginal opening   Posterior fourchette desensitization at 6 o'clock with gentle downward pressure and coordinated breathing  Desensitization attempted at 5 o'clock (L) but patient with increased tenderness and demonstrated a panic response to touch   Patient educated throughout PFM internal interventions and cued throughout to perform diaphragmatic breathing. Patient returned demonstration.  Patient Response to interventions: Internal desensitization technique stopped after light touch at 5 o'clock due to increased tenderness and increased sympathetic nervous system response demonstrated through shallow breathing, lability, and LE muscle spasms.    ASSESSMENT Patient presents to clinic with excellent motivation to participate in today's session. Patient demonstrates deficits in posture, pain, PFM coordination, PFM extensibility, and IAP management. Patient consented to PFM internal interventions. Patient performed excellent diaphragmatic breathing throughout posterior fourchette desensitization with minimal cueing. Patient able to tolerate pressure at the vaginal opening (6 o'clock) and the tissues desensitized (decreased pressure) with diaphragmatic breathing. Internal interventions seized once touch was placed at 5 o'clock (L) due to an increased sympathetic bodily response through shallow breathing, lability, and LE muscle spasms. Patient discussion on the body's response to pain and how future sessions will proceed with PFM internal techniques. Patient will benefit from continued skilled physical therapy to address remaining deficits in posture, pain, PFM coordination, PFM extensibility, and IAP management  in order to increase prior level of  function and increase overall QOL.       PT Long Term Goals - 08/14/21 1351       PT LONG TERM GOAL #1   Title Patient will demonstrate  independence with HEP in order to maximize therapeutic gains and improve carryover from physical therapy sessions to ADLs in the home and community.    Baseline IE: not initiated    Time 12    Period Weeks    Status On-going    Target Date 09/17/21      PT LONG TERM GOAL #2   Title Patient will demonstrate improved function as evidenced by a score of 68 on FOTO measure for full participation in activities at home and in the community.    Baseline IE: 60; 9/29: 62    Time 12    Period Weeks    Status On-going    Target Date 09/17/21      PT LONG TERM GOAL #3   Title Patient will decrease worst pain as reported on NPRS by at least 2 points to demonstrate clinically significant reduction in pain in order to restore/improve function and overall QOL.    Baseline IE: 10/10 vaginal penetration    Time 12    Period Weeks    Status On-going    Target Date 09/17/21      PT LONG TERM GOAL #4   Title Patient will demonstrate coordinated lengthening and relaxation of PFM with diaphragmatic inhalation in order to decrease spasm and allow for unrestricted elimination of urine/feces for improved overall QOL.    Baseline IE: not demonstrated    Time 12    Period Weeks    Status On-going    Target Date 09/17/21                   Plan - 08/26/21 1114     Clinical Impression Statement Patient presents to clinic with excellent motivation to participate in today's session. Patient demonstrates deficits in posture, pain, PFM coordination, PFM extensibility, and IAP management. Patient consented to PFM internal interventions. Patient performed excellent diaphragmatic breathing throughout posterior fourchette desensitization with minimal cueing. Patient able to tolerate pressure at the vaginal opening (6 o'clock) and the tissues desensitized (decreased pressure) with diaphragmatic breathing. Internal interventions seized once touch was placed at 5 o'clock (L) due to an increased sympathetic bodily  response through shallow breathing, lability, and LE muscle spasms. Patient discussion on the body's response to pain and how future sessions will proceed with PFM internal techniques. Patient will benefit from continued skilled physical therapy to address remaining deficits in posture, pain, PFM coordination, PFM extensibility, and IAP management  in order to increase prior level of function and increase overall QOL.    Personal Factors and Comorbidities Comorbidity 3+;Behavior Pattern;Time since onset of injury/illness/exacerbation;Past/Current Experience    Comorbidities asthma, s/p hysterectomy (1976), CAD with stent placed 2018, essential HTN, s/p appendectomy, arthritis    Examination-Activity Limitations Other    Examination-Participation Restrictions Interpersonal Relationship    Stability/Clinical Decision Making Evolving/Moderate complexity    Rehab Potential Good    PT Frequency 1x / week    PT Duration 12 weeks    PT Treatment/Interventions Cryotherapy;Electrical Stimulation;Moist Heat;Therapeutic exercise;Neuromuscular re-education;Patient/family education;Orthotic Fit/Training;Manual techniques;Scar mobilization;Taping;Dry needling    PT Next Visit Plan Body proprioception training    PT Home Exercise Plan N/A    Consulted and Agree with Plan of Care Patient             Patient will  benefit from skilled therapeutic intervention in order to improve the following deficits and impairments:  Pain, Postural dysfunction, Improper body mechanics, Increased muscle spasms, Decreased strength, Decreased coordination, Decreased activity tolerance, Increased fascial restricitons  Visit Diagnosis: Pelvic pain  Abnormal posture  Other lack of coordination     Problem List There are no problems to display for this patient.  Melainie Krinsky, SPT  This entire session was performed under direct supervision and direction of a licensed Estate agent. I have personally  read, edited and approve of the note as written.   Sheria Lang PT, DPT [phone]  08/26/2021, 2:30 PM  [facility] [address] [place], [place], [address] Phone: [phone]   Fax:  [phone]  Name: Becky Mitchell MRN: [id number] Date of Birth: 05-28-1951

## 2021-09-11 ENCOUNTER — Ambulatory Visit: Payer: Medicare Other | Admitting: Physical Therapy

## 2021-09-11 ENCOUNTER — Other Ambulatory Visit: Payer: Self-pay

## 2021-09-11 ENCOUNTER — Encounter: Payer: Self-pay | Admitting: Physical Therapy

## 2021-09-11 DIAGNOSIS — R278 Other lack of coordination: Secondary | ICD-10-CM

## 2021-09-11 DIAGNOSIS — R102 Pelvic and perineal pain: Secondary | ICD-10-CM | POA: Diagnosis not present

## 2021-09-11 DIAGNOSIS — R293 Abnormal posture: Secondary | ICD-10-CM
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
## 2021-09-18 ENCOUNTER — Ambulatory Visit: Payer: Medicare Other | Attending: Obstetrics and Gynecology | Admitting: Physical Therapy

## 2021-09-18 ENCOUNTER — Encounter: Payer: Self-pay | Admitting: Physical Therapy

## 2021-09-18 ENCOUNTER — Other Ambulatory Visit: Payer: Self-pay

## 2021-09-18 DIAGNOSIS — R278 Other lack of coordination: Secondary | ICD-10-CM

## 2021-09-18 DIAGNOSIS — R293 Abnormal posture: Secondary | ICD-10-CM | POA: Diagnosis present

## 2021-09-18 DIAGNOSIS — R102 Pelvic and perineal pain: Secondary | ICD-10-CM | POA: Diagnosis present

## 2021-09-18 NOTE — Therapy (Signed)
[facility] [address] [place], [place], [address] Phone: [phone]   Fax:  [phone]  Physical Therapy Treatment  Patient Details  Name: Becky Mitchell MRN: [id number] Date of Birth: 07-Dec-1950 Referring Provider (PT): Christeen Douglas   Encounter Date: 09/18/2021   PT End of Session - 09/18/21 1327     Visit Number 11    Number of Visits 24    Date for PT Re-Evaluation 12/11/21    Authorization Type IE 06/25/2021    PT Start Time 1330    PT Stop Time 1410    PT Time Calculation (min) 40 min    Activity Tolerance Patient tolerated treatment well    Behavior During Therapy [facility] for tasks assessed/performed             History reviewed. No pertinent past medical history.  Past Surgical History:  Procedure Laterality Date   ABDOMINAL HYSTERECTOMY  1975   CARDIAC CATHETERIZATION  04/20/2017   spine cyst aspiration  10/28/2017    There were no vitals filed for this visit.   Subjective Assessment - 09/18/21 1332     Subjective Patient notes no significant changes or concerns. Was able to use graded exposure strategy for larger dilator without any adverse effect. Patient notes she was unsure if it was effective because she felt no difference better or worse.    Currently in Pain? No/denies             TREATMENT  Neuromuscular Re-education: Patient educated on the purpose of the pelvic exam and articulated understanding; patient consented to the exam verbally. Pudendal nerve desensitization/re-education with use of q-tip. Dull/fine point discrimination utilized as tolerated. Patient able to articulate location of touch with 50% initial accuracy, 90% end accuracy.    Patient Response to interventions: Comfortable to return in ~ 2 weeks.   ASSESSMENT Patient presents to clinic with excellent motivation to participate in today's session. Patient demonstrates deficits in posture, pain, PFM coordination, PFM  extensibility, and scar tissue elasticity. Patient has made significant progress toward goals (see below) over the course of therapy. Patient able to participate in sensory re-education activity today with starting accuracy of 50% and ending accuracy of 90%. Patient in agreement to continue participation in physical therapy to progress toward goal achievement.  Patient will benefit from continued skilled physical therapy to address remaining deficits in posture, pain, PFM coordination, PFM extensibility, and scar tissue elasticity in order to increase prior level of function and increase overall QOL.      PT Long Term Goals - 09/18/21 1421       PT LONG TERM GOAL #1   Title Patient will demonstrate independence with HEP in order to maximize therapeutic gains and improve carryover from physical therapy sessions to ADLs in the home and community.    Baseline IE: not initiated; 10/27: continues with HEP and dilator training    Time 12    Period Weeks    Status On-going    Target Date 12/11/21      PT LONG TERM GOAL #2   Title Patient will demonstrate improved function as evidenced by a score of 68 on FOTO measure for full participation in activities at home and in the community.    Baseline IE: 60; 9/29: 62; 10/27: 64    Time 12    Period Weeks    Status On-going    Target Date 12/11/21      PT LONG TERM GOAL #3  Title Patient will decrease worst pain as reported on NPRS by at least 2 points to demonstrate clinically significant reduction in pain in order to restore/improve function and overall QOL.    Baseline IE: 10/10 vaginal penetration; 10/27: 5/10 with dilator insertion (#3)    Time 12    Period Weeks    Status On-going    Target Date 12/11/21      PT LONG TERM GOAL #4   Title Patient will demonstrate coordinated lengthening and relaxation of PFM with diaphragmatic inhalation in order to decrease spasm and allow for unrestricted elimination of urine/feces for improved overall QOL.     Baseline IE: not demonstrated; 10/27: performs IND with HEP    Time 12    Period Weeks    Status Achieved                   Plan - 09/18/21 1327     Clinical Impression Statement Patient presents to clinic with excellent motivation to participate in today's session. Patient demonstrates deficits in posture, pain, PFM coordination, PFM extensibility, and scar tissue elasticity. Patient has made significant progress toward goals (see below) over the course of therapy. Patient able to participate in sensory re-education activity today with starting accuracy of 50% and ending accuracy of 90%. Patient in agreement to continue participation in physical therapy to progress toward goal achievement.  Patient will benefit from continued skilled physical therapy to address remaining deficits in posture, pain, PFM coordination, PFM extensibility, and scar tissue elasticity in order to increase prior level of function and increase overall QOL.    Personal Factors and Comorbidities Comorbidity 3+;Behavior Pattern;Time since onset of injury/illness/exacerbation;Past/Current Experience    Comorbidities asthma, s/p hysterectomy (1976), CAD with stent placed 2018, essential HTN, s/p appendectomy, arthritis    Examination-Activity Limitations Other    Examination-Participation Restrictions Interpersonal Relationship    Stability/Clinical Decision Making Evolving/Moderate complexity    Rehab Potential Good    PT Frequency 1x / week    PT Duration 12 weeks    PT Treatment/Interventions Cryotherapy;Electrical Stimulation;Moist Heat;Therapeutic exercise;Neuromuscular re-education;Patient/family education;Orthotic Fit/Training;Manual techniques;Scar mobilization;Taping;Dry needling    PT Next Visit Plan Body proprioception training (cotton swab)    PT Home Exercise Plan graded exposure handout    Consulted and Agree with Plan of Care Patient             Patient will benefit from skilled therapeutic  intervention in order to improve the following deficits and impairments:  Pain, Postural dysfunction, Improper body mechanics, Increased muscle spasms, Decreased strength, Decreased coordination, Decreased activity tolerance, Increased fascial restricitons  Visit Diagnosis: Pelvic pain  Abnormal posture  Other lack of coordination     Problem List There are no problems to display for this patient.   Sheria Lang PT, DPT [phone]  09/18/2021, 2:27 PM  [place] [facility] [address] [place], [place], [address] Phone: [phone]   Fax:  [phone]  Name: Paulett Kaufhold MRN: [id number] Date of Birth: 1950/12/03

## 2021-09-29 ENCOUNTER — Other Ambulatory Visit: Payer: Self-pay

## 2021-09-29 ENCOUNTER — Ambulatory Visit: Payer: Medicare Other | Admitting: Physical Therapy

## 2021-09-29 DIAGNOSIS — R102 Pelvic and perineal pain: Secondary | ICD-10-CM

## 2021-09-29 DIAGNOSIS — R278 Other lack of coordination: Secondary | ICD-10-CM

## 2021-09-29 DIAGNOSIS — R293 Abnormal posture: Secondary | ICD-10-CM

## 2021-09-29 NOTE — Therapy (Signed)
[facility] [address] [place], [place], [address] Phone: [phone]   Fax:  [phone]  Physical Therapy Treatment  Patient Details  Name: Becky Mitchell MRN: [id number] Date of Birth: 01/15/1951 Referring Provider (PT): Christeen Douglas   Encounter Date: 09/29/2021   PT End of Session - 09/29/21 1641     Visit Number 12    Number of Visits 24    Date for PT Re-Evaluation 12/11/21    Authorization Type IE 06/25/2021    PT Start Time 1330    PT Stop Time 1410    PT Time Calculation (min) 40 min    Activity Tolerance Patient tolerated treatment well    Behavior During Therapy [facility] for tasks assessed/performed             No past medical history on file.  Past Surgical History:  Procedure Laterality Date   ABDOMINAL HYSTERECTOMY  1975   CARDIAC CATHETERIZATION  04/20/2017   spine cyst aspiration  10/28/2017    There were no vitals filed for this visit.   Subjective Assessment - 09/29/21 1639     Subjective Patient noting some frustration with dilator training as she feels like she is plateuing. She reports that she is only able to get the larger dilator inserted partially before she feels like she is hitting a wall. Patient notes there is no pain with hitting the wall, but there is frustration and confusion.    Currently in Pain? No/denies               TREATMENT  Manual Therapy: Patient educated on the purpose of the pelvic exam and articulated understanding; patient consented to the exam verbally. STM of puborectalis, iliococcygeus, obturator internus. Performed B. Noted mildly increased tension on R, but able to tolerate with reduction in pain/pressure with utilization of breath. Dilator training/graded exposure with nervous system downregulation techniques throughout and TPR of deeper PFM.    Patient Response to interventions: Comfortable to return in ~ 2 weeks.   ASSESSMENT Patient presents to  clinic with excellent motivation to participate in today's session. Patient demonstrates deficits in posture, pain, PFM coordination, PFM extensibility, and scar tissue elasticity. Patient able to tolerate insertion of size 6 dilator to a depth of roughly 15 cm with excellent response and skilled application of nervous system downregulation techniques/strategies.  Patient will benefit from continued skilled physical therapy to address remaining deficits in posture, pain, PFM coordination, PFM extensibility, and scar tissue elasticity in order to increase prior level of function and increase overall QOL.       PT Long Term Goals - 09/18/21 1421       PT LONG TERM GOAL #1   Title Patient will demonstrate independence with HEP in order to maximize therapeutic gains and improve carryover from physical therapy sessions to ADLs in the home and community.    Baseline IE: not initiated; 10/27: continues with HEP and dilator training    Time 12    Period Weeks    Status On-going    Target Date 12/11/21      PT LONG TERM GOAL #2   Title Patient will demonstrate improved function as evidenced by a score of 68 on FOTO measure for full participation in activities at home and in the community.    Baseline IE: 60; 9/29: 62; 10/27: 64    Time 12    Period Weeks    Status On-going    Target Date 12/11/21  PT LONG TERM GOAL #3   Title Patient will decrease worst pain as reported on NPRS by at least 2 points to demonstrate clinically significant reduction in pain in order to restore/improve function and overall QOL.    Baseline IE: 10/10 vaginal penetration; 10/27: 5/10 with dilator insertion (#3)    Time 12    Period Weeks    Status On-going    Target Date 12/11/21      PT LONG TERM GOAL #4   Title Patient will demonstrate coordinated lengthening and relaxation of PFM with diaphragmatic inhalation in order to decrease spasm and allow for unrestricted elimination of urine/feces for improved overall  QOL.    Baseline IE: not demonstrated; 10/27: performs IND with HEP    Time 12    Period Weeks    Status Achieved                   Plan - 09/29/21 1641     Clinical Impression Statement Patient presents to clinic with excellent motivation to participate in today's session. Patient demonstrates deficits in posture, pain, PFM coordination, PFM extensibility, and scar tissue elasticity. Patient able to tolerate insertion of size 6 dilator to a depth of roughly 15 cm with excellent response and skilled application of nervous system downregulation techniques/strategies.  Patient will benefit from continued skilled physical therapy to address remaining deficits in posture, pain, PFM coordination, PFM extensibility, and scar tissue elasticity in order to increase prior level of function and increase overall QOL.    Personal Factors and Comorbidities Comorbidity 3+;Behavior Pattern;Time since onset of injury/illness/exacerbation;Past/Current Experience    Comorbidities asthma, s/p hysterectomy (1976), CAD with stent placed 2018, essential HTN, s/p appendectomy, arthritis    Examination-Activity Limitations Other    Examination-Participation Restrictions Interpersonal Relationship    Stability/Clinical Decision Making Evolving/Moderate complexity    Rehab Potential Good    PT Frequency 1x / week    PT Duration 12 weeks    PT Treatment/Interventions Cryotherapy;Electrical Stimulation;Moist Heat;Therapeutic exercise;Neuromuscular re-education;Patient/family education;Orthotic Fit/Training;Manual techniques;Scar mobilization;Taping;Dry needling    PT Next Visit Plan dilator training progression    PT Home Exercise Plan graded exposure handout    Consulted and Agree with Plan of Care Patient             Patient will benefit from skilled therapeutic intervention in order to improve the following deficits and impairments:  Pain, Postural dysfunction, Improper body mechanics, Increased muscle  spasms, Decreased strength, Decreased coordination, Decreased activity tolerance, Increased fascial restricitons  Visit Diagnosis: Pelvic pain  Abnormal posture  Other lack of coordination     Problem List There are no problems to display for this patient.   Sheria Lang PT, DPT [phone]  09/29/2021, 4:52 PM  [facility] [address] [place], [place], [address] Phone: [phone]   Fax:  [phone]  Name: Becky Mitchell MRN: [id number] Date of Birth: [birth year]

## 2021-10-02 ENCOUNTER — Ambulatory Visit: Payer: Medicare Other | Admitting: Physical Therapy

## 2021-10-16 ENCOUNTER — Encounter: Payer: Self-pay | Admitting: Physical Therapy

## 2021-10-16 ENCOUNTER — Ambulatory Visit: Payer: Medicare Other | Attending: Obstetrics and Gynecology | Admitting: Physical Therapy

## 2021-10-16 ENCOUNTER — Other Ambulatory Visit
Admission: RE | Admit: 2021-10-16 | Discharge: 2021-10-16 | Disposition: A | Discharge status: Home / Self Care | Payer: Medicare Other | Source: Ambulatory Visit | Attending: Internal Medicine | Admitting: Internal Medicine

## 2021-10-16 ENCOUNTER — Other Ambulatory Visit: Payer: Self-pay

## 2021-10-16 DIAGNOSIS — Z01818 Encounter for other preprocedural examination: Secondary | ICD-10-CM | POA: Insufficient documentation

## 2021-10-16 DIAGNOSIS — I2510 Atherosclerotic heart disease of native coronary artery without angina pectoris: Secondary | ICD-10-CM | POA: Insufficient documentation

## 2021-10-16 DIAGNOSIS — Z79899 Other long term (current) drug therapy: Secondary | ICD-10-CM | POA: Diagnosis not present

## 2021-10-16 DIAGNOSIS — R278 Other lack of coordination: Secondary | ICD-10-CM | POA: Diagnosis present

## 2021-10-16 DIAGNOSIS — R293 Abnormal posture: Secondary | ICD-10-CM | POA: Insufficient documentation

## 2021-10-16 DIAGNOSIS — R102 Pelvic and perineal pain: Secondary | ICD-10-CM | POA: Diagnosis present

## 2021-10-16 DIAGNOSIS — R0602 Shortness of breath: Secondary | ICD-10-CM | POA: Insufficient documentation

## 2021-10-16 LAB — BRAIN NATRIURETIC PEPTIDE: B Natriuretic Peptide: 34.2 pg/mL (ref 0.0–100.0)

## 2021-10-16 NOTE — Therapy (Signed)
[facility] [address] [place], [place], [address] Phone: [phone]   Fax:  [phone]  Physical Therapy Treatment  Patient Details  Name: Becky Mitchell MRN: [id number] Date of Birth: Feb 07, 1951 Referring Provider (PT): Christeen Douglas   Encounter Date: 10/16/2021   PT End of Session - 10/16/21 1404     Visit Number 13    Number of Visits 24    Date for PT Re-Evaluation 12/11/21    Authorization Type IE 06/25/2021    PT Start Time 1405    PT Stop Time 1445    PT Time Calculation (min) 40 min    Activity Tolerance Patient tolerated treatment well    Behavior During Therapy [facility] for tasks assessed/performed             History reviewed. No pertinent past medical history.  Past Surgical History:  Procedure Laterality Date   ABDOMINAL HYSTERECTOMY  1975   CARDIAC CATHETERIZATION  04/20/2017   spine cyst aspiration  10/28/2017    There were no vitals filed for this visit.   Subjective Assessment - 10/16/21 1406     Subjective Patient states that she had visit cardiology this AM and is going to need to have a catheterization before the Christmas. Patient notes 60% success rate with largest dilator.    Currently in Pain? No/denies             TREATMENT  Neuromuscular Re-education: Reassessed goals; see below.  Patient education on sensate focus, graded return to penetrative sex with emphasis on monitoring for nervous system response. Handout provided.   Patient Response to interventions: To return in 11/2021 after cardiac procedure.   ASSESSMENT Patient presents to clinic with excellent motivation to participate in today's session. Patient demonstrates deficits in posture, pain, PFM coordination, PFM extensibility, and scar tissue elasticity. Patient appreciative of materials on sensate focus and graded return to sexual activity. Patient and DPT agree to period of self-management in the context of cardiac  procedure and caregiver duties with return in January 2023. Patient will benefit from continued skilled physical therapy to address remaining deficits in posture, pain, PFM coordination, PFM extensibility, and scar tissue elasticity in order to increase prior level of function and increase overall QOL.     PT Long Term Goals - 10/16/21 1421       PT LONG TERM GOAL #1   Title Patient will demonstrate independence with HEP in order to maximize therapeutic gains and improve carryover from physical therapy sessions to ADLs in the home and community.    Baseline IE: not initiated; 10/27: continues with HEP and dilator training; 12/1: continuing with progressive dilator training    Time 12    Period Weeks    Status On-going    Target Date 12/11/21      PT LONG TERM GOAL #2   Title Patient will demonstrate improved function as evidenced by a score of 68 on FOTO measure for full participation in activities at home and in the community.    Baseline IE: 60; 9/29: 62; 10/27: 64; 12/1: 62    Time 12    Period Weeks    Status On-going    Target Date 12/11/21      PT LONG TERM GOAL #3   Title Patient will decrease worst pain as reported on NPRS by at least 2 points to demonstrate clinically significant reduction in pain in order to restore/improve function and overall QOL.    Baseline IE: 10/10  vaginal penetration; 10/27: 5/10 with dilator insertion (#3); 12/1: 2/10 with dilator insertion 40% of the time    Time 12    Period Weeks    Status On-going    Target Date 12/11/21      PT LONG TERM GOAL #4   Title Patient will demonstrate coordinated lengthening and relaxation of PFM with diaphragmatic inhalation in order to decrease spasm and allow for unrestricted elimination of urine/feces for improved overall QOL.    Baseline IE: not demonstrated; 10/27: performs IND with HEP    Time 12    Period Weeks    Status Achieved                   Plan - 10/16/21 1404     Clinical Impression  Statement Patient presents to clinic with excellent motivation to participate in today's session. Patient demonstrates deficits in posture, pain, PFM coordination, PFM extensibility, and scar tissue elasticity. Patient appreciative of materials on sensate focus and graded return to sexual activity. Patient and DPT agree to period of self-management in the context of cardiac procedure and caregiver duties with return in January 2023. Patient will benefit from continued skilled physical therapy to address remaining deficits in posture, pain, PFM coordination, PFM extensibility, and scar tissue elasticity in order to increase prior level of function and increase overall QOL.    Personal Factors and Comorbidities Comorbidity 3+;Behavior Pattern;Time since onset of injury/illness/exacerbation;Past/Current Experience    Comorbidities asthma, s/p hysterectomy (1976), CAD with stent placed 2018, essential HTN, s/p appendectomy, arthritis    Examination-Activity Limitations Other    Examination-Participation Restrictions Interpersonal Relationship    Stability/Clinical Decision Making Evolving/Moderate complexity    Rehab Potential Good    PT Frequency 1x / week    PT Duration 12 weeks    PT Treatment/Interventions Cryotherapy;Electrical Stimulation;Moist Heat;Therapeutic exercise;Neuromuscular re-education;Patient/family education;Orthotic Fit/Training;Manual techniques;Scar mobilization;Taping;Dry needling    PT Next Visit Plan dilator training progression    PT Home Exercise Plan graded exposure handout    Consulted and Agree with Plan of Care Patient             Patient will benefit from skilled therapeutic intervention in order to improve the following deficits and impairments:  Pain, Postural dysfunction, Improper body mechanics, Increased muscle spasms, Decreased strength, Decreased coordination, Decreased activity tolerance, Increased fascial restricitons  Visit Diagnosis: Pelvic  pain  Abnormal posture  Other lack of coordination     Problem List There are no problems to display for this patient.   Sheria Lang PT, DPT [phone]  10/16/2021, 4:55 PM  [facility] [address] [place], [place], [address] Phone: [phone]   Fax:  [phone]  Name: Becky Mitchell MRN: [id number] Date of Birth: 02-09-1951

## 2021-11-06 ENCOUNTER — Encounter
Admission: RE | Disposition: A | Discharge status: Home / Self Care | Payer: Self-pay | Source: Ambulatory Visit | Attending: Internal Medicine

## 2021-11-06 ENCOUNTER — Other Ambulatory Visit: Payer: Self-pay

## 2021-11-06 ENCOUNTER — Ambulatory Visit
Admission: RE | Admit: 2021-11-06 | Discharge: 2021-11-06 | Disposition: A | Discharge status: Home / Self Care | Payer: Medicare Other | Source: Ambulatory Visit | Attending: Internal Medicine | Admitting: Internal Medicine

## 2021-11-06 ENCOUNTER — Encounter: Payer: Self-pay | Admitting: Internal Medicine

## 2021-11-06 ENCOUNTER — Ambulatory Visit: Payer: Medicare Other | Admitting: Physical Therapy

## 2021-11-06 DIAGNOSIS — I25110 Atherosclerotic heart disease of native coronary artery with unstable angina pectoris: Secondary | ICD-10-CM | POA: Diagnosis not present

## 2021-11-06 DIAGNOSIS — Z955 Presence of coronary angioplasty implant and graft: Secondary | ICD-10-CM | POA: Insufficient documentation

## 2021-11-06 DIAGNOSIS — I200 Unstable angina: Secondary | ICD-10-CM

## 2021-11-06 HISTORY — PX: LEFT HEART CATH AND CORONARY ANGIOGRAPHY: CATH118249

## 2021-11-06 SURGERY — LEFT HEART CATH AND CORONARY ANGIOGRAPHY
Anesthesia: Moderate Sedation

## 2021-11-06 MED ORDER — MIDAZOLAM HCL 2 MG/2ML IJ SOLN
INTRAMUSCULAR | Status: AC
  Filled 2021-11-06: qty 2

## 2021-11-06 MED ORDER — HEPARIN (PORCINE) IN NACL 1000-0.9 UT/500ML-% IV SOLN
INTRAVENOUS | Status: AC
  Filled 2021-11-06: qty 1000

## 2021-11-06 MED ORDER — SODIUM CHLORIDE 0.9% FLUSH: 3.0000 mL | Freq: Two times a day (BID) | INTRAVENOUS | Status: DC

## 2021-11-06 MED ORDER — HEPARIN SODIUM (PORCINE) 1000 UNIT/ML IJ SOLN
INTRAMUSCULAR | Status: AC
  Filled 2021-11-06: qty 10

## 2021-11-06 MED ORDER — ASPIRIN 81 MG PO CHEW
CHEWABLE_TABLET | ORAL | Status: AC
  Filled 2021-11-06: qty 1

## 2021-11-06 MED ORDER — FENTANYL CITRATE (PF) 100 MCG/2ML IJ SOLN
INTRAMUSCULAR | Status: AC
  Filled 2021-11-06: qty 2

## 2021-11-06 MED ORDER — ASPIRIN 81 MG PO CHEW
81.0000 mg | CHEWABLE_TABLET | ORAL | Status: AC
  Administered 2021-11-06: 12:00:00 81 mg via ORAL

## 2021-11-06 MED ORDER — SODIUM CHLORIDE 0.9 % WEIGHT BASED INFUSION
3.0000 mL/kg/h | INTRAVENOUS | Status: AC
  Administered 2021-11-06: 11:00:00 3 mL/kg/h via INTRAVENOUS

## 2021-11-06 MED ORDER — FENTANYL CITRATE (PF) 100 MCG/2ML IJ SOLN
INTRAMUSCULAR | Status: DC | PRN
  Administered 2021-11-06: 25 ug via INTRAVENOUS

## 2021-11-06 MED ORDER — IOHEXOL 300 MG/ML  SOLN
INTRAMUSCULAR | Status: DC | PRN
  Administered 2021-11-06: 14:00:00 50 mL

## 2021-11-06 MED ORDER — MIDAZOLAM HCL 2 MG/2ML IJ SOLN
INTRAMUSCULAR | Status: DC | PRN
  Administered 2021-11-06: 1 mg via INTRAVENOUS

## 2021-11-06 MED ORDER — HEPARIN (PORCINE) IN NACL 1000-0.9 UT/500ML-% IV SOLN
INTRAVENOUS | Status: DC | PRN
  Administered 2021-11-06 (×2): 500 mL

## 2021-11-06 MED ORDER — SODIUM CHLORIDE 0.9 % WEIGHT BASED INFUSION: 1.0000 mL/kg/h | INTRAVENOUS | Status: DC

## 2021-11-06 MED ORDER — VERAPAMIL HCL 2.5 MG/ML IV SOLN
INTRAVENOUS | Status: AC
  Filled 2021-11-06: qty 2

## 2021-11-06 MED ORDER — SODIUM CHLORIDE 0.9 % IV SOLN: 250.0000 mL | INTRAVENOUS | Status: DC | PRN

## 2021-11-06 MED ORDER — SODIUM CHLORIDE 0.9% FLUSH: 3.0000 mL | INTRAVENOUS | Status: DC | PRN

## 2021-11-06 MED ORDER — LIDOCAINE HCL 1 % IJ SOLN
INTRAMUSCULAR | Status: AC
  Filled 2021-11-06: qty 20

## 2021-11-06 MED ORDER — LIDOCAINE HCL (PF) 1 % IJ SOLN
INTRAMUSCULAR | Status: DC | PRN
  Administered 2021-11-06: 2 mL

## 2021-11-06 SURGICAL SUPPLY — 15 items
CATH INFINITI 5FR MULTPACK ANG (CATHETERS) ×1 IMPLANT
DEVICE CLOSURE MYNXGRIP 5F (Vascular Products) ×1 IMPLANT
DEVICE RAD TR BAND REGULAR (VASCULAR PRODUCTS) ×1 IMPLANT
GLIDESHEATH SLEND SS 6F .021 (SHEATH) ×1 IMPLANT
GUIDEWIRE INQWIRE 1.5J.035X260 (WIRE) IMPLANT
INQWIRE 1.5J .035X260CM (WIRE) ×2
NDL PERC 18GX7CM (NEEDLE) IMPLANT
NEEDLE PERC 18GX7CM (NEEDLE) ×2 IMPLANT
PACK CARDIAC CATH (CUSTOM PROCEDURE TRAY) ×2 IMPLANT
PROTECTION STATION PRESSURIZED (MISCELLANEOUS) ×2
SET ATX SIMPLICITY (MISCELLANEOUS) ×1 IMPLANT
SHEATH AVANTI 5FR X 11CM (SHEATH) ×1 IMPLANT
STATION PROTECTION PRESSURIZED (MISCELLANEOUS) IMPLANT
TUBING CIL FLEX 10 FLL-RA (TUBING) ×1 IMPLANT
WIRE GUIDERIGHT .035X150 (WIRE) ×1 IMPLANT

## 2021-11-06 NOTE — Progress Notes (Signed)
Dr. Juliann Pares at bedside, speaking with pt. And spouse Peyton Najjar re: cath results. Both verbalize understanding of conversation.

## 2021-11-06 NOTE — Progress Notes (Signed)
Pt. Assisted OOB, to BR, then back to room. Right groin with 1-2+ edema: no hematoma, ecchymosis, drainage. Right wrist without hematoma, edema, drainage, ecchymosis. Pt. Stable for DC home.  \

## 2021-11-07 ENCOUNTER — Encounter: Payer: Self-pay | Admitting: Internal Medicine

## 2021-11-27 ENCOUNTER — Other Ambulatory Visit: Payer: Self-pay

## 2021-11-27 ENCOUNTER — Ambulatory Visit: Payer: Managed Care, Other (non HMO) | Attending: Obstetrics and Gynecology | Admitting: Physical Therapy

## 2021-11-27 ENCOUNTER — Encounter: Payer: Self-pay | Admitting: Physical Therapy

## 2021-11-27 DIAGNOSIS — R293 Abnormal posture: Secondary | ICD-10-CM | POA: Diagnosis present

## 2021-11-27 DIAGNOSIS — R102 Pelvic and perineal pain: Secondary | ICD-10-CM | POA: Diagnosis present

## 2021-11-27 DIAGNOSIS — R278 Other lack of coordination: Secondary | ICD-10-CM | POA: Insufficient documentation

## 2021-11-27 NOTE — Therapy (Signed)
[facility] [address] [place], [place], [address] Phone: [phone]   Fax:  [phone]  Physical Therapy Treatment  Patient Details  Name: Becky Mitchell MRN: [id number] Date of Birth: 1951-08-31 Referring Provider (PT): Benjaman Kindler   Encounter Date: 11/27/2021   PT End of Session - 11/27/21 1437     Visit Number 15    Number of Visits 24    Date for PT Re-Evaluation 12/11/21    Authorization Type IE 06/25/2021    PT Start Time 1400    PT Stop Time 1430    PT Time Calculation (min) 30 min    Activity Tolerance Patient tolerated treatment well    Behavior During Therapy [facility] for tasks assessed/performed             History reviewed. No pertinent past medical history.  Past Surgical History:  Procedure Laterality Date   ABDOMINAL HYSTERECTOMY  1975   CARDIAC CATHETERIZATION  04/20/2017   LEFT HEART CATH AND CORONARY ANGIOGRAPHY N/A 11/06/2021   Procedure: LEFT HEART CATH AND CORONARY ANGIOGRAPHY;  Surgeon: Yolonda Kida, MD;  Location: [facility];  Service: Cardiovascular;  Laterality: N/A;   spine cyst aspiration  10/28/2017    There were no vitals filed for this visit.   Subjective Assessment - 11/27/21 1400     Subjective Patient reports that she had good results with recent cardiac cath procedure. Patient notes that she has attempted penetration twice with no success, but did find that the pain was not in the usual location. Patient does still have significant bruising from cath procedure on inner R thigh.    Currently in Pain? No/denies            TREATMENT  Neuromuscular Re-education: Reviewed HEP and discussed dilator options. Patient education on fear avoidance model of pelvic pain and discussed importance of celebrating even small progress as a means to break the cycle of catastrophizing thoughts.    Patient Response to interventions: Comfortable to practice HEP for 1  month before return.  ASSESSMENT Patient presents to clinic with excellent motivation to participate in today's session. Patient demonstrates deficits in posture, pain, PFM coordination, PFM extensibility, and scar tissue elasticity. Patient with reduced nervous system upregulation and able to cogently articulate application of dilator training, sensate focus, and graded return to sexual activity. Patient will benefit from continued skilled physical therapy to address remaining deficits in posture, pain, PFM coordination, PFM extensibility, and scar tissue elasticity in order to increase prior level of function and increase overall QOL.      PT Long Term Goals - 10/16/21 1421       PT LONG TERM GOAL #1   Title Patient will demonstrate independence with HEP in order to maximize therapeutic gains and improve carryover from physical therapy sessions to ADLs in the home and community.    Baseline IE: not initiated; 10/27: continues with HEP and dilator training; 12/1: continuing with progressive dilator training    Time 12    Period Weeks    Status On-going    Target Date 12/11/21      PT LONG TERM GOAL #2   Title Patient will demonstrate improved function as evidenced by a score of 68 on FOTO measure for full participation in activities at home and in the community.    Baseline IE: 60; 9/29: 62; 10/27: 64; 12/1: 62    Time 12    Period Weeks    Status  On-going    Target Date 12/11/21      PT LONG TERM GOAL #3   Title Patient will decrease worst pain as reported on NPRS by at least 2 points to demonstrate clinically significant reduction in pain in order to restore/improve function and overall QOL.    Baseline IE: 10/10 vaginal penetration; 10/27: 5/10 with dilator insertion (#3); 12/1: 2/10 with dilator insertion 40% of the time    Time 12    Period Weeks    Status On-going    Target Date 12/11/21      PT LONG TERM GOAL #4   Title Patient will demonstrate coordinated lengthening and  relaxation of PFM with diaphragmatic inhalation in order to decrease spasm and allow for unrestricted elimination of urine/feces for improved overall QOL.    Baseline IE: not demonstrated; 10/27: performs IND with HEP    Time 12    Period Weeks    Status Achieved                   Plan - 11/27/21 1437     Clinical Impression Statement Patient presents to clinic with excellent motivation to participate in today's session. Patient demonstrates deficits in posture, pain, PFM coordination, PFM extensibility, and scar tissue elasticity. Patient with reduced nervous system upregulation and able to cogently articulate application of dilator training, sensate focus, and graded return to sexual activity. Patient will benefit from continued skilled physical therapy to address remaining deficits in posture, pain, PFM coordination, PFM extensibility, and scar tissue elasticity in order to increase prior level of function and increase overall QOL.    Personal Factors and Comorbidities Comorbidity 3+;Behavior Pattern;Time since onset of injury/illness/exacerbation;Past/Current Experience    Comorbidities asthma, s/p hysterectomy (1976), CAD with stent placed 2018, essential HTN, s/p appendectomy, arthritis    Examination-Activity Limitations Other    Examination-Participation Restrictions Interpersonal Relationship    Stability/Clinical Decision Making Evolving/Moderate complexity    Rehab Potential Good    PT Frequency 1x / week    PT Duration 12 weeks    PT Treatment/Interventions Cryotherapy;Electrical Stimulation;Moist Heat;Therapeutic exercise;Neuromuscular re-education;Patient/family education;Orthotic Fit/Training;Manual techniques;Scar mobilization;Taping;Dry needling    PT Next Visit Plan dilator training progression    PT Home Exercise Plan graded exposure handout    Consulted and Agree with Plan of Care Patient             Patient will benefit from skilled therapeutic intervention  in order to improve the following deficits and impairments:  Pain, Postural dysfunction, Improper body mechanics, Increased muscle spasms, Decreased strength, Decreased coordination, Decreased activity tolerance, Increased fascial restricitons  Visit Diagnosis: Pelvic pain  Abnormal posture  Other lack of coordination     Problem List There are no problems to display for this patient.   Myles Gip PT, DPT [phone]  11/27/2021, 2:48 PM  [facility] [address] [place], [place], [address] Phone: [phone]   Fax:  [phone]  Name: Becky Mitchell MRN: [id number] Date of Birth: [birth year]

## 2021-12-25 ENCOUNTER — Encounter: Payer: Self-pay | Admitting: Physical Therapy

## 2021-12-25 ENCOUNTER — Ambulatory Visit: Payer: Managed Care, Other (non HMO) | Attending: Obstetrics and Gynecology | Admitting: Physical Therapy

## 2021-12-25 ENCOUNTER — Other Ambulatory Visit: Payer: Self-pay

## 2021-12-25 DIAGNOSIS — R293 Abnormal posture: Secondary | ICD-10-CM | POA: Insufficient documentation

## 2021-12-25 DIAGNOSIS — R278 Other lack of coordination: Secondary | ICD-10-CM | POA: Insufficient documentation

## 2021-12-25 DIAGNOSIS — R102 Pelvic and perineal pain: Secondary | ICD-10-CM | POA: Insufficient documentation

## 2021-12-25 NOTE — Therapy (Signed)
[facility] [address] [place], [place], [address] Phone: [phone]   Fax:  [phone]  Patient Details  Name: Becky Mitchell MRN: [id number] Date of Birth: May 28, 1951 Referring Provider:  Christeen Douglas, MD  Encounter Date: 12/25/2021  Patient presented to clinic to self-discharge due to increased confidence in management of primary complaint. Patient denied any new concerns and expressed appreciation for services/interventions provided during course of care. Patient provided with contact information and encouraged to reach out should her needs change or symptoms worsen.   Sheria Lang PT, DPT [phone]  12/25/2021, 6:19 PM  [place] [facility] [address] [place], [place], [address] Phone: [phone]   Fax:  [phone]

## 2022-02-03 ENCOUNTER — Other Ambulatory Visit: Payer: Self-pay | Admitting: Internal Medicine

## 2022-02-03 DIAGNOSIS — Z1231 Encounter for screening mammogram for malignant neoplasm of breast: Secondary | ICD-10-CM

## 2022-03-18 ENCOUNTER — Ambulatory Visit
Admission: RE | Admit: 2022-03-18 | Discharge: 2022-03-18 | Disposition: A | Discharge status: Home / Self Care | Payer: Managed Care, Other (non HMO) | Source: Ambulatory Visit | Attending: Internal Medicine | Admitting: Internal Medicine

## 2022-03-18 DIAGNOSIS — Z1231 Encounter for screening mammogram for malignant neoplasm of breast: Secondary | ICD-10-CM | POA: Diagnosis not present

## 2022-03-24 ENCOUNTER — Other Ambulatory Visit: Payer: Self-pay | Admitting: *Deleted

## 2022-03-24 ENCOUNTER — Inpatient Hospital Stay
Admission: RE | Admit: 2022-03-24 | Discharge: 2022-03-24 | Disposition: A | Discharge status: Home / Self Care | Payer: Self-pay | Source: Ambulatory Visit | Attending: *Deleted | Admitting: *Deleted

## 2022-03-24 DIAGNOSIS — Z1231 Encounter for screening mammogram for malignant neoplasm of breast: Secondary | ICD-10-CM

## 2022-03-25 ENCOUNTER — Other Ambulatory Visit: Payer: Self-pay | Admitting: Internal Medicine

## 2022-03-25 DIAGNOSIS — R921 Mammographic calcification found on diagnostic imaging of breast: Secondary | ICD-10-CM

## 2022-03-25 DIAGNOSIS — N6489 Other specified disorders of breast: Secondary | ICD-10-CM

## 2022-03-25 DIAGNOSIS — R928 Other abnormal and inconclusive findings on diagnostic imaging of breast: Secondary | ICD-10-CM

## 2022-04-06 ENCOUNTER — Ambulatory Visit
Admission: RE | Admit: 2022-04-06 | Discharge: 2022-04-06 | Disposition: A | Discharge status: Home / Self Care | Payer: Managed Care, Other (non HMO) | Source: Ambulatory Visit | Attending: Internal Medicine | Admitting: Internal Medicine

## 2022-04-06 DIAGNOSIS — R928 Other abnormal and inconclusive findings on diagnostic imaging of breast: Secondary | ICD-10-CM | POA: Diagnosis present

## 2022-04-06 DIAGNOSIS — N6489 Other specified disorders of breast: Secondary | ICD-10-CM

## 2022-04-06 DIAGNOSIS — R921 Mammographic calcification found on diagnostic imaging of breast: Secondary | ICD-10-CM | POA: Diagnosis present

## 2022-04-10 ENCOUNTER — Other Ambulatory Visit: Payer: Self-pay | Admitting: Internal Medicine

## 2022-04-10 DIAGNOSIS — R921 Mammographic calcification found on diagnostic imaging of breast: Secondary | ICD-10-CM

## 2022-04-10 DIAGNOSIS — R928 Other abnormal and inconclusive findings on diagnostic imaging of breast: Secondary | ICD-10-CM

## 2022-04-27 ENCOUNTER — Ambulatory Visit
Admission: RE | Admit: 2022-04-27 | Discharge: 2022-04-27 | Disposition: A | Discharge status: Home / Self Care | Payer: Managed Care, Other (non HMO) | Source: Ambulatory Visit | Attending: Internal Medicine | Admitting: Internal Medicine

## 2022-04-27 DIAGNOSIS — R928 Other abnormal and inconclusive findings on diagnostic imaging of breast: Secondary | ICD-10-CM | POA: Insufficient documentation

## 2022-04-27 DIAGNOSIS — R921 Mammographic calcification found on diagnostic imaging of breast: Secondary | ICD-10-CM | POA: Diagnosis present

## 2022-04-27 HISTORY — PX: BREAST BIOPSY: SHX20

## 2022-04-29 LAB — SURGICAL PATHOLOGY

## 2022-05-05 ENCOUNTER — Other Ambulatory Visit: Payer: Self-pay | Admitting: Internal Medicine

## 2022-05-05 DIAGNOSIS — R921 Mammographic calcification found on diagnostic imaging of breast: Secondary | ICD-10-CM

## 2022-05-20 ENCOUNTER — Ambulatory Visit
Admission: RE | Admit: 2022-05-20 | Discharge: 2022-05-20 | Disposition: A | Discharge status: Home / Self Care | Payer: Managed Care, Other (non HMO) | Source: Ambulatory Visit | Attending: Internal Medicine | Admitting: Internal Medicine

## 2022-05-20 ENCOUNTER — Encounter: Payer: Self-pay | Admitting: Radiology

## 2022-05-20 DIAGNOSIS — R921 Mammographic calcification found on diagnostic imaging of breast: Secondary | ICD-10-CM

## 2022-05-20 HISTORY — PX: BREAST BIOPSY: SHX20

## 2022-05-21 ENCOUNTER — Encounter: Payer: Self-pay | Admitting: *Deleted

## 2022-05-21 LAB — SURGICAL PATHOLOGY

## 2022-05-21 NOTE — Progress Notes (Signed)
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  Appointment scheduled for surgical consultation with Dr. Lemar Livings on 05/28/22.

## 2022-05-28 ENCOUNTER — Other Ambulatory Visit: Payer: Self-pay | Admitting: General Surgery

## 2022-05-28 DIAGNOSIS — N6092 Unspecified benign mammary dysplasia of left breast: Secondary | ICD-10-CM

## 2022-05-29 ENCOUNTER — Other Ambulatory Visit: Payer: Self-pay | Admitting: General Surgery

## 2022-05-29 NOTE — Progress Notes (Signed)
Progress Notes - documented in this encounter Becky Mitchell, Becky Billet, MD - 05/28/2022 1:45 PM EDT Formatting of this note is different from the original. Images from the original note were not included. Subjective:   Patient ID: Becky Mitchell is a 72 y.o. female.  HPI  The following portions of the patient's history were reviewed and updated as appropriate.  This a new patient is here today for: office visit. Here for a breast evaluation referred by Dr Nemiah Commander. 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  The patient had significant bruising after the first biopsy, less so after the second.  She states she could not feel anything different in the breast prior to her mammogram. She denies any breast injury or trauma.  The patient experienced some chest pain in December 2022 when walking up gradient. This was relieved with the institution of Imdur. She subsequently underwent cardiac catheterization with no significant findings.  She admits to wearing bra size: 36D  The patient reports she had a chronic recurring cyst removed from the medial aspect of the left breast decades ago and that it took a long time for the defect a "feeling".  She is here with her husband, [name].  Review of Systems  Constitutional: Negative for chills and fever.  Respiratory: Negative for cough.   Chief Complaint  Patient presents with  New Patient    BP (!) 167/85  Pulse 82  Temp 36.8 C (98.2 F)  Ht 158.8 cm (5' 2.5")  Wt 69.4 kg (153 lb)  SpO2 98%  BMI 27.54 kg/m   Past Medical History:  Diagnosis Date  Allergy 1970  Arthritis 2020  Asthma, unspecified asthma severity, unspecified whether complicated, unspecified whether persistent 1998  Heart murmur, unspecified 2022  History of cataract 2020  corrected  Hyperlipidemia 2018  under control  Hypertension 1976    Past Surgical History:  Procedure Laterality Date   cardiac catheterization 04/20/2017  spinal cyst aspiration 10/28/2017  coronary angiography Left 11/06/2021  heart cath  BREAST EXCISIONAL BIOPSY Left 04/27/2022  EXTREMELY SCANT AND SCATTERED DETACHED GROUPS OF DUCTAL EPITHELIAL CELLS IN A BACKGROUND OF ADIPOSE TISSUE  BREAST EXCISIONAL BIOPSY Left 05/20/2022  AT LEAST ATYPICAL DUCTAL HYPERPLASIA, WITH ASSOCIATED CALCIFICATIONS.  APPENDECTOMY  CATARACT EXTRACTION 2020  HYSTERECTOMY 1975    OB History   Gravida  1  Para  1  Term  1  Preterm   AB   Living  1    SAB   IAB   Ectopic   Molar   Multiple   Live Births     Obstetric Comments  Age at first period 20 Age of first pregnancy        Social History   Socioeconomic History  Marital status: Married  Tobacco Use  Smoking status: Never  Smokeless tobacco: Never  Substance and Sexual Activity  Alcohol use: Yes  Alcohol/week: 3.0 standard drinks  Types: 3 Glasses of wine per week  Comment: ocassional  Drug use: Never  Sexual activity: Yes  Partners: Male  Birth control/protection: Surgical, Post-menopausal    Allergies  Allergen Reactions  Bromfenac Other (See Comments)  MADE EYELIDS RED & EYES BLOODSHOT  Loteprednol Etabonate Other (See Comments)  MADE EYELIDS RED & EYES BLOODSHOT  Moxifloxacin Other (See Comments)  EYE DROPS---MADE EYELIDS RED & EYES BLOODSHOT  Sulfa (Sulfonamide Antibiotics) Other (See Comments)  Patient doesn't know but states "its been since I was a child."  Animator Other (See  Comments)  Adhesive Tape-Silicones Itching and Rash  Latex Rash  Added based on information entered during case entry, please review and add reactions, type, and severity as needed   Current Outpatient Medications  Medication Sig Dispense Refill  albuterol 90 mcg/actuation inhaler Inhale into the lungs  aspirin 81 MG EC tablet Take by mouth  calcium carbonate (CALCIUM 600 ORAL) Take 600 mg by mouth once daily  cholecalciferol  (VITAMIN D3) 1000 unit capsule Take 1 capsule (1,000 Units total) by mouth once daily  coconut oil, bulk, Oil Apply topically  EPINEPHrine (EPIPEN) 0.3 mg/0.3 mL auto-injector  estradioL (ESTRING) 2 mg (7.5 mcg /24 hour) vaginal ring Place 1 ring (2 mg total) vaginally every 3 (three) months 1 ring 0  ezetimibe (ZETIA) 10 mg tablet Take 1 tablet (10 mg total) by mouth once daily 30 tablet 11  fexofenadine (ALLEGRA) 180 MG tablet Take 1 tablet (180 mg total) by mouth once daily  isosorbide mononitrate (IMDUR) 30 MG ER tablet TAKE 1 TABLET BY MOUTH EVERY DAY 30 tablet 1  losartan (COZAAR) 50 MG tablet Take 1 tablet (50 mg total) by mouth once daily 90 tablet 3  metoprolol succinate (TOPROL-XL) 50 MG XL tablet Take 1 tablet (50 mg total) by mouth once daily 90 tablet 3  potassium citrate (UROCIT-K) 10 mEq ER tablet Take 1 tablet (10 mEq total) by mouth once daily 90 tablet 3  rosuvastatin (CRESTOR) 40 MG tablet Take 1 tablet (40 mg total) by mouth once daily 90 tablet 3   No current facility-administered medications for this visit.   Family History  Problem Relation Age of Onset  High blood pressure (Hypertension) Mother  Stroke Mother  Thyroid disease Mother  Diabetes type II Father  deceased  High blood pressure (Hypertension) Father  Deceased  Colon polyps Sister  also myself  Coronary Artery Disease (Blocked arteries around heart) Sister  and myself  Diabetes type II Sister  High blood pressure (Hypertension) Sister  Skin cancer Sister  High blood pressure (Hypertension) Sister  Diabetes type II Brother  Hyperlipidemia (Elevated cholesterol) Brother  High blood pressure (Hypertension) Brother  Prostate cancer Brother  Bipolar disorder Daughter  Breast cancer Maternal Aunt  Colon cancer Paternal Aunt   Labs and Radiology:   Cardiac catheterization November 06, 2021:  Ost LM to Mid LM lesion is 25% stenosed. Ost LAD to Prox LAD lesion is 40% stenosed. Previously placed  Prox RCA to Mid RCA stent (unknown type) is widely patent. Previously placed Mid Cx to Dist Cx stent (unknown type) is widely patent. The left ventricular systolic function is normal. LV end diastolic pressure is normal. The left ventricular ejection fraction is 55-65% by visual estimate. Widely patent mid circumflex stent Widely patent mid RCA stent LAD had moderate to severe calcification proximal and mid with 30 to 50% diffuse lesion nonobstructive  May 21, 2019 23-second attempt left breast biopsy, stereotactic.  A. BREAST, LEFT UPPER OUTER QUADRANT; STEREOTACTIC CORE NEEDLE BIOPSY:  - AT LEAST ATYPICAL DUCTAL HYPERPLASIA, WITH ASSOCIATED CALCIFICATIONS.  - BACKGROUND MAMMARY PARENCHYMA WITH PRIOR BIOPSY SITE CHANGE, FAT  NECROSIS, AND FOCAL LOBULAR NEOPLASIA.  - NEGATIVE FOR INVASIVE CARCINOMA.   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 Excision is recommended  for definitive classification.   Imaging review:  Imaging studies from [facility] dated 2020 through [facility]  studies dated May 20, 2022 were independently reviewed. In the first postbiopsy imaging the calcifications were not removed and the postbiopsy clip was about 2 cm away from the target. On the May 20, 2022 biopsy of the clip corresponded exactly to the location of the now removed microcalcifications.   Objective:  Physical Exam Exam conducted with a chaperone present.  Constitutional:  Appearance: Normal appearance.  Cardiovascular:  Rate and Rhythm: Normal rate and regular rhythm.  Pulses: Normal pulses.  Heart sounds: Normal heart sounds.  Pulmonary:  Effort: Pulmonary effort is normal.  Breath sounds: Normal breath sounds.  Chest:  Breasts: Right: Normal.   Comments: No evidence of erythema or warmth. Good breast symmetry. Musculoskeletal:   Cervical back: Neck supple.  Lymphadenopathy:  Upper Body:  Right upper body: No supraclavicular or axillary adenopathy.  Left upper body: No supraclavicular or axillary adenopathy.  Skin: General: Skin is warm and dry.  Neurological:  Mental Status: She is alert and oriented to person, place, and time.  Psychiatric:  Mood and Affect: Mood normal.  Behavior: Behavior normal.    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  Plan:   Indications for excisional biopsy were reviewed. Little likelihood of upstaging to invasive cancer.  The patient came with a long list of questions, all of which were addressed prior to her departure. Arrangements to be made for wire localization of the previously placed COIL clip the morning of the procedure.  Patient does not need to discontinue aspirin therapy prior to her upcoming open biopsy.   This note is partially prepared by Dorathy Daft, RN, acting as a scribe in the presence of Dr. Donnalee Curry, MD.  The documentation recorded by the scribe accurately reflects the service I personally performed and the decisions made by me.   Earline Mayotte, MD FACS   Electronically signed by Rosezella Florida, MD at 05/28/2022 2:59 PM EDT

## 2022-06-01 ENCOUNTER — Other Ambulatory Visit: Payer: Self-pay | Admitting: General Surgery

## 2022-06-01 DIAGNOSIS — N6092 Unspecified benign mammary dysplasia of left breast: Secondary | ICD-10-CM

## 2022-06-04 ENCOUNTER — Encounter
Admission: RE | Admit: 2022-06-04 | Discharge: 2022-06-04 | Disposition: A | Discharge status: Home / Self Care | Payer: Managed Care, Other (non HMO) | Source: Ambulatory Visit | Attending: General Surgery | Admitting: General Surgery

## 2022-06-04 VITALS — Ht 62.25 in | Wt 153.0 lb

## 2022-06-04 DIAGNOSIS — I10 Essential (primary) hypertension: Secondary | ICD-10-CM

## 2022-06-04 DIAGNOSIS — K760 Fatty (change of) liver, not elsewhere classified: Secondary | ICD-10-CM

## 2022-06-04 DIAGNOSIS — Z79899 Other long term (current) drug therapy: Secondary | ICD-10-CM

## 2022-06-04 DIAGNOSIS — I25119 Atherosclerotic heart disease of native coronary artery with unspecified angina pectoris: Secondary | ICD-10-CM

## 2022-06-04 DIAGNOSIS — Z01818 Encounter for other preprocedural examination: Secondary | ICD-10-CM

## 2022-06-04 HISTORY — DX: Essential (primary) hypertension: I10

## 2022-06-04 HISTORY — DX: Atherosclerotic heart disease of native coronary artery without angina pectoris: I25.10

## 2022-06-04 HISTORY — DX: Fatty (change of) liver, not elsewhere classified: K76.0

## 2022-06-04 HISTORY — DX: Peripheral vascular angioplasty status with implants and grafts: Z95.820

## 2022-06-04 HISTORY — DX: Unspecified asthma, uncomplicated: J45.909

## 2022-06-04 HISTORY — DX: Family history of other specified conditions: Z84.89

## 2022-06-04 HISTORY — DX: Unspecified osteoarthritis, unspecified site: M19.90

## 2022-06-04 HISTORY — DX: Cardiac murmur, unspecified: R01.1

## 2022-06-04 HISTORY — DX: Other complications of anesthesia, initial encounter: T88.59XA

## 2022-06-04 HISTORY — DX: Hyperlipidemia, unspecified: E78.5

## 2022-06-04 NOTE — Patient Instructions (Signed)
Your procedure is scheduled on:06-12-22 Friday Report to [facility] @ 7:40 am. Then proceed to the Medical Mall and check in at the Registration Desk on the 1st floor. Then proceed to the Radiology desk (2nd desk on right)  REMEMBER: Instructions that are not followed completely may result in serious medical risk, up to and including death; or upon the discretion of your surgeon and anesthesiologist your surgery may need to be rescheduled.  Do not eat food after midnight the night before surgery.  No gum chewing, lozengers or hard candies.  You may however, drink CLEAR liquids up to 2 hours before you are scheduled to arrive for your surgery. Do not drink anything within 2 hours of your scheduled arrival time.  Clear liquids include: - water  - apple juice without pulp - gatorade (not RED colors) - [place] coffee or tea (Do NOT add milk or creamers to the coffee or tea) Do NOT drink anything that is not on this list.  TAKE THESE MEDICATIONS THE MORNING OF SURGERY WITH A SIP OF WATER: -ezetimibe (ZETIA)  -isosorbide mononitrate (IMDUR)  Bring your Albuterol Inhaler to the hospital the day of surgery  Continue your 81 mg Aspirin up until the day prior to surgery per Dr Lemar Livings. Do NOT take the day of surgery  One week prior to surgery: Stop Anti-inflammatories (NSAIDS) such as Advil, Aleve, Ibuprofen, Motrin, Naproxen, Naprosyn and Aspirin based products such as Excedrin, Goodys Powder, BC Powder.You may however, take Tylenol if needed for pain up until the day of surgery.  Stop ANY OVER THE COUNTER supplements/vitamins NOW (06-04-22) until after surgery (Calcium, Vitamin D3, Co Q-10)-Continue your Potassium up until the day prior to surgery as it is a prescription  No Alcohol for 24 hours before or after surgery.  No Smoking including e-cigarettes for 24 hours prior to surgery.  No chewable tobacco products for at least 6 hours prior to surgery.  No nicotine patches on the day of  surgery.  Do not use any "recreational" drugs for at least a week prior to your surgery.  Please be advised that the combination of cocaine and anesthesia may have negative outcomes, up to and including death. If you test positive for cocaine, your surgery will be cancelled.  On the morning of surgery brush your teeth with toothpaste and water, you may rinse your mouth with mouthwash if you wish. Do not swallow any toothpaste or mouthwash.  Use CHG Soap as directed on instruction sheet.  Do not wear jewelry, make-up, hairpins, clips or nail polish.  Do not wear lotions, powders, or perfumes.   Do not shave body from the neck down 48 hours prior to surgery just in case you cut yourself which could leave a site for infection.  Also, freshly shaved skin may become irritated if using the CHG soap.  Contact lenses, hearing aids and dentures may not be worn into surgery.  Do not bring valuables to the hospital. [facility] is not responsible for any missing/lost belongings or valuables.   Notify your doctor if there is any change in your medical condition (cold, fever, infection).  Wear comfortable clothing (specific to your surgery type) to the hospital.  After surgery, you can help prevent lung complications by doing breathing exercises.  Take deep breaths and cough every 1-2 hours. Your doctor may order a device called an Incentive Spirometer to help you take deep breaths. When coughing or sneezing, hold a pillow firmly against your incision with both hands. This is  called "splinting." Doing this helps protect your incision. It also decreases belly discomfort.  If you are being admitted to the hospital overnight, leave your suitcase in the car. After surgery it may be brought to your room.  If you are being discharged the day of surgery, you will not be allowed to drive home. You will need a responsible adult (18 years or older) to drive you home and stay with you that night.   If  you are taking public transportation, you will need to have a responsible adult (18 years or older) with you. Please confirm with your physician that it is acceptable to use public transportation.   Please call the Pre-admissions Testing Dept. at [phone] if you have any questions about these instructions.  Surgery Visitation Policy:  Patients undergoing a surgery or procedure may have two family members or support persons with them as long as the person is not COVID-19 positive or experiencing its symptoms.

## 2022-06-05 ENCOUNTER — Encounter
Admission: RE | Admit: 2022-06-05 | Discharge: 2022-06-05 | Disposition: A | Discharge status: Home / Self Care | Payer: Managed Care, Other (non HMO) | Source: Ambulatory Visit | Attending: General Surgery | Admitting: General Surgery

## 2022-06-05 DIAGNOSIS — K760 Fatty (change of) liver, not elsewhere classified: Secondary | ICD-10-CM

## 2022-06-05 DIAGNOSIS — Z01818 Encounter for other preprocedural examination: Secondary | ICD-10-CM | POA: Diagnosis present

## 2022-06-05 DIAGNOSIS — I10 Essential (primary) hypertension: Secondary | ICD-10-CM | POA: Insufficient documentation

## 2022-06-05 DIAGNOSIS — Z79899 Other long term (current) drug therapy: Secondary | ICD-10-CM | POA: Diagnosis not present

## 2022-06-05 DIAGNOSIS — I25119 Atherosclerotic heart disease of native coronary artery with unspecified angina pectoris: Secondary | ICD-10-CM | POA: Insufficient documentation

## 2022-06-05 LAB — COMPREHENSIVE METABOLIC PANEL
ALT: 41 U/L (ref 0–44)
AST: 45 U/L — ABNORMAL HIGH (ref 15–41)
Albumin: 4.2 g/dL (ref 3.5–5.0)
Alkaline Phosphatase: 120 U/L (ref 38–126)
Anion gap: 7 (ref 5–15)
BUN: 20 mg/dL (ref 8–23)
CO2: 27 mmol/L (ref 22–32)
Calcium: 9.5 mg/dL (ref 8.9–10.3)
Chloride: 105 mmol/L (ref 98–111)
Creatinine, Ser: 0.91 mg/dL (ref 0.44–1.00)
GFR, Estimated: >60 mL/min (ref >60)
Glucose, Bld: 98 mg/dL (ref 70–99)
Potassium: 3.7 mmol/L (ref 3.5–5.1)
Sodium: 139 mmol/L (ref 135–145)
Total Bilirubin: 0.9 mg/dL (ref 0.3–1.2)
Total Protein: 7.3 g/dL (ref 6.5–8.1)

## 2022-06-05 LAB — CBC
HCT: 44.2 % (ref 36.0–46.0)
Hemoglobin: 15.2 g/dL — ABNORMAL HIGH (ref 12.0–15.0)
MCH: 31.2 pg (ref 26.0–34.0)
MCHC: 34.4 g/dL (ref 30.0–36.0)
MCV: 90.8 fL (ref 80.0–100.0)
Platelets: 145 10*3/uL — ABNORMAL LOW (ref 150–400)
RBC: 4.87 MIL/uL (ref 3.87–5.11)
RDW: 12.5 % (ref 11.5–15.5)
WBC: 5.4 10*3/uL (ref 4.0–10.5)
nRBC: 0 % (ref 0.0–0.2)

## 2022-06-08 ENCOUNTER — Encounter: Payer: Self-pay | Admitting: General Surgery

## 2022-06-09 ENCOUNTER — Encounter: Payer: Self-pay | Admitting: General Surgery

## 2022-06-09 NOTE — Progress Notes (Signed)
Perioperative Services  Pre-Admission/Anesthesia Testing Clinical Review  Date: 06/10/22  Patient Demographics:  Name: Becky Mitchell DOB:   1951-01-03 MRN:   [id number]  Planned Surgical Procedure(s):    Case: [id number] Date/Time: 06/12/22 0836   Procedure: BREAST BIOPSY WITH NEEDLE LOCALIZATION (Left)   Anesthesia type: General   Pre-op diagnosis: atypical ductal hyperplasia   Location: [facility] OR ROOM 02 / [facility]   Surgeons: Robert Bellow, MD   NOTE: Available PAT nursing documentation and vital signs have been reviewed. Clinical nursing staff has updated patient's PMH/PSHx, current medication list, and drug allergies/intolerances to ensure comprehensive history available to assist in medical decision making as it pertains to the aforementioned surgical procedure and anticipated anesthetic course. Extensive review of available clinical information performed. Kendrick PMH and PSHx updated with any diagnoses/procedures that  may have been inadvertently omitted during her intake with the pre-admission testing department's nursing staff.  Clinical Discussion:  Becky Mitchell is a 71 y.o. female who is submitted for pre-surgical anesthesia review and clearance prior to her undergoing the above procedure. Patient has never been a smoker. Pertinent PMH includes: CAD cardiac murmur angina, HTN, GERD, OA, calcifications LEFT breast.    Patient is followed by cardiology Clayborn Bigness, MD). She was last seen in the cardiology clinic on 05/06/2022; notes reviewed. At the time of her clinic visit, the patient denied any chest pain, shortness of breath, PND, orthopnea, palpitations, significant peripheral edema, vertiginous symptoms, or presyncope/syncope.  Patient with a PMH significant for cardiovascular diagnoses.  Patient underwent diagnostic left heart catheterization on 04/20/2017 at [facility].  Study revealed a normal left ventricular  systolic function with an EF of 60%.  There was multivessel CAD; 30% proximal LAD, 40% ostial OM1, 85% proximal OM2, and 85% proximal RCA.  PCI subsequently performed placing a 2.5 x 12 mm Promus Premier DES to the proximal OM lesion and a 3.0 x 20 mm Promus Premier DES to the proximal RCA lesion.  Intervention yielded excellent angiographic results, 0% residual stenosis, and TIMI-3 flow.  Myocardial perfusion imaging study was performed on 04/28/2021 revealing a normal left ventricular systolic function with a hyperdynamic LVEF of 68%.  There were no regional wall motion abnormalities.  There was no evidence of stress-induced myocardial ischemia or arrhythmia; no scintigraphic evidence of scar.  Study determined to be normal and low risk.  TTE performed on 04/28/2021 revealed a normal left ventricular systolic function with an EF of >55%. Diastolic Doppler parameters consistent with abnormal relaxation (G1DD).  There was trivial to mild pan valvular regurgitation.  There was no evidence of a significant transvalvular gradient to suggest stenosis.  Repeat diagnostic left heart catheterization was performed on 11/06/2021 revealing normal left ventricular systolic function with an LVEF of 55 to 65%.  LVEDP normal.  There was multivessel CAD; 25% ostial to mid LM, 40% ostial proximal LAD, and 30 to 50% proximal and mid LAD.  Previously placed stents to the RCA and LCx were widely patent.  Further intervention was deferred opting for medical management.  Blood pressure elevated at 158/90 on currently prescribed nitrate (isosorbide mononitrate), ARB (losartan), and beta-blocker (metoprolol succinate) therapies.  Patient is on a statin (rosuvastatin) + ezetimibe for her HLD diagnosis and further ASCVD prevention.  She is not diabetic. Functional capacity, as defined by DASI, is documented as being >/= 4 METS.  No changes were made to her medication regimen.  Patient to follow-up with outpatient cardiology in 1  year or sooner if needed.  Becky Mitchell was found to have an abnormal mammogram.  She subsequently stereotactic LEFT breast biopsy on 04/27/2022 that demonstrated extremely scant and scattered patchy groups of ductal epithelial cells in a background of adipose tissue, therefore biopsy was nondiagnostic.  She underwent repeat biopsy on 05/20/2022 that revealed at least atypical ductal hyperplasia with associated calcifications.  Sample was negative for invasive carcinoma.  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  Patient has been seen in consult by general surgery Bary Castilla, MD) for consultation.  Decision has been made to pursue an EXCISIONAL BREAST BIOPSY WITH NEEDLE LOCALIZATION on 06/12/2022.  Given patient's past medical history significant for cardiovascular diagnoses, presurgical cardiac clearance was sought by the PAT team. Per cardiology, "this patient is optimized for surgery and may proceed with the planned procedural course with a LOW risk of significant perioperative cardiovascular complications". In review of her medication reconciliation, it is noted the patient is on daily antiplatelet therapy.  Per direction from patient's primary attending surgeon, patient instructed to continue her daily low-dose ASA throughout her perioperative course.  Patient reports previous perioperative complications with anesthesia in the past.  Patient advises that she experienced (+) delayed emergence from anesthesia used for a routine colonoscopy in the past.  In review of the available records, it is noted that patient underwent a MAC anesthetic course at [facility] (AS[facility]) in 08/2019 without documented complications.      06/04/2022   12:34 PM 11/06/2021    4:00 PM 11/06/2021    2:45 PM  Vitals with BMI  Height 5' 2.25"    Weight 153 lbs    BMI 09.23    Systolic  300   Diastolic  76   Pulse  73 73    Providers/Specialists:   NOTE:  Primary physician provider listed below. Patient may have been seen by APP or partner within same practice.   PROVIDER ROLE / SPECIALTY LAST OV  Bary Castilla Forest Gleason, MD General Surgery (Surgeon) 05/28/2022  Gladstone Lighter, MD Primary Care Provider 02/02/2022  Katrine Coho, MD Cardiology 05/06/2022   Allergies:  Fire ant, Sulfa antibiotics, Bromfenac, Loteprednol etabonate, Moxifloxacin, and Latex  Current Home Medications:   No current facility-administered medications for this encounter.    albuterol (VENTOLIN HFA) 108 (90 Base) MCG/ACT inhaler   aspirin EC 81 MG tablet   Calcium Carbonate (CALCIUM 600 PO)   Cholecalciferol (VITAMIN D3 PO)   coconut oil OIL   Coenzyme Q10-Vitamin E (COQ10 ST-100 PO)   EPINEPHrine 0.3 mg/0.3 mL IJ SOAJ injection   ezetimibe (ZETIA) 10 MG tablet   fexofenadine (ALLEGRA) 180 MG tablet   isosorbide mononitrate (IMDUR) 30 MG 24 hr tablet   losartan (COZAAR) 50 MG tablet   metoprolol succinate (TOPROL-XL) 50 MG 24 hr tablet   potassium citrate (UROCIT-K) 10 MEQ (1080 MG) SR tablet   rosuvastatin (CRESTOR) 40 MG tablet   History:   Past Medical History:  Diagnosis Date   Allergies    Anginal pain (HCC)    Arthritis    Asthma    Breast calcification, left    a.) Bx 05/20/2022 --> at least ADH, however features concerning for DCIS   Cataracts, bilateral    a.) s/p extraction [id number]   Complication of anesthesia    a.) delayed emergence following routine colonoscopy   Coronary artery disease 04/20/2017   a.) LHC/PCI [place] 04/20/2017 --> 85% pRCA, 85% pOM --> 2.5 x  12 mm Promus Primere DES to pRCA and 3.0 x 20 mm Promus Primere DES to pLCx; b.) LHC 11/06/2021: EF 55-65%, 25% p-mLM, 40% o-pLAD, 30-50% p-mLAD --> med mgmt.   Dyspareunia in female    Family history of adverse reaction to anesthesia    a.) delayed emergence in first degree relative (mother)   Fatty liver    Heart murmur    Hyperlipidemia    Hypertension    Past  Surgical History:  Procedure Laterality Date   ABDOMINAL HYSTERECTOMY  1975   BREAST BIOPSY Left 04/27/2022   stereo bx/ x clip/ extremely scant and scattered detached groups of ductal epithelial cells in a background of adipose tissue   BREAST BIOPSY Left 05/20/2022   stereo bx, calcs, "COIL" clip-atypical ductal hyperplasia with associated calcifications.  Background mammary parenchyma with prior biopsy site change, fat necrosis, and focal lobular neoplasia.  Negative for invasive carcinoma.  Concerning for DCIS.   BREAST LUMPECTOMY Left 2001   CATARACT EXTRACTION Bilateral 2020   CORONARY ANGIOPLASTY WITH STENT PLACEMENT Left 04/20/2017   Procedure: CORONARY ANGIOPLASTY WITH STENT PLACEMENT; Location: [place]; Surgeon: Ola Spurr, MD   FOOT SURGERY Left    LEFT HEART CATH AND CORONARY ANGIOGRAPHY N/A 11/06/2021   Procedure: LEFT HEART CATH AND CORONARY ANGIOGRAPHY;  Surgeon: Yolonda Kida, MD;  Location: [facility];  Service: Cardiovascular;  Laterality: N/A;   spine cyst aspiration  10/28/2017   No family history on file. Social History   Tobacco Use   Smoking status: Never    Passive exposure: Past (ex spouse smoked in home 28 years)   Smokeless tobacco: Never  Vaping Use   Vaping Use: Never used  Substance Use Topics   Alcohol use: Yes    Alcohol/week: 5.0 standard drinks of alcohol    Types: 5 Glasses of wine per week    Comment: wine-3-4 times a week   Drug use: Never    Pertinent Clinical Results:  LABS: Labs reviewed: Acceptable for surgery.  No visits with results within 3 Day(s) from this visit.  Latest known visit with results is:  Hospital Outpatient Visit on 06/05/2022  Component Date Value Ref Range Status   WBC 06/05/2022 5.4  4.0 - 10.5 K/uL Final   RBC 06/05/2022 4.87  3.87 - 5.11 MIL/uL Final   Hemoglobin 06/05/2022 15.2 (H)  12.0 - 15.0 g/dL Final   HCT 06/05/2022 44.2  36.0 - 46.0 % Final   MCV 06/05/2022 90.8  80.0 -  100.0 fL Final   MCH 06/05/2022 31.2  26.0 - 34.0 pg Final   MCHC 06/05/2022 34.4  30.0 - 36.0 g/dL Final   RDW 06/05/2022 12.5  11.5 - 15.5 % Final   Platelets 06/05/2022 145 (L)  150 - 400 K/uL Final   nRBC 06/05/2022 0.0  0.0 - 0.2 % Final   Performed at [facility], [address] [place], [place] [address]   Sodium 06/05/2022 139  135 - 145 mmol/L Final   Potassium 06/05/2022 3.7  3.5 - 5.1 mmol/L Final   Chloride 06/05/2022 105  98 - 111 mmol/L Final   CO2 06/05/2022 27  22 - 32 mmol/L Final   Glucose, Bld 06/05/2022 98  70 - 99 mg/dL Final   Glucose reference range applies only to samples taken after fasting for at least 8 hours.   BUN 06/05/2022 20  8 - 23 mg/dL Final   Creatinine, Ser 06/05/2022 0.91  0.44 - 1.00 mg/dL  Final   Calcium 06/05/2022 9.5  8.9 - 10.3 mg/dL Final   Total Protein 06/05/2022 7.3  6.5 - 8.1 g/dL Final   Albumin 06/05/2022 4.2  3.5 - 5.0 g/dL Final   AST 06/05/2022 45 (H)  15 - 41 U/L Final   ALT 06/05/2022 41  0 - 44 U/L Final   Alkaline Phosphatase 06/05/2022 120  38 - 126 U/L Final   Total Bilirubin 06/05/2022 0.9  0.3 - 1.2 mg/dL Final   GFR, Estimated 06/05/2022 >60  >60 mL/min Final   Comment: (NOTE) Calculated using the CKD-EPI Creatinine Equation (2021)    Anion gap 06/05/2022 7  5 - 15 Final   Performed at [facility], [address] [place], [place] [address]    ECG: Date: 06/05/2022 Time ECG obtained: 1102 AM Rate: 65 bpm Rhythm: normal sinus Axis (leads I and aVF): Normal Intervals: PR 172 ms. QRS 74 ms. QTc 416 ms. ST segment and T wave changes: No evidence of acute ST segment elevation or depression Comparison: Similar to previous tracing obtained on 02/17/2021   IMAGING / PROCEDURES: LEFT HEART CATHETERIZATION AND CORONARY ANGIOGRAPHY performed on 11/07/2019 Normal left ventricular systolic function with an EF of 55 to 65% LVEDP normal Multivessel CAD 25% stenosis ostial to mid LM 40% stenosis ostial  to proximal LAD 30-50% stenosis proximal to mid LAD Widely patent stents to the RCA and LCx Recommendations Unsuccessful attempt to cannulate right radial artery vessel was very small. Successful cardiac cath from right femoral artery approach All vessels had TIMI-3 flow, therefore further intervention was deferred.  Patient tolerated well.   MYOCARDIAL PERFUSION IMAGING STUDY (LEXISCAN) performed on 04/28/2021 Normal left ventricular systolic function with EF 68% Normal myocardial thickening and wall motion No artifact Left ventricular cavity size normal no evidence of stress-induced myocardial ischemia or arrhythmia; no scintigraphic evidence of scar The overall quality of the study is good  TRANSTHORACIC ECHOCARDIOGRAM performed on 04/28/2021 Normal left ventricular systolic function with EF of >83% Diastolic Doppler parameters consistent with abnormal relaxation (G1DD). Trivial AR and PR Mild MR and TR Normal gradients; no valvular stenosis No pericardial effusion  Impression and Plan:  Majorie Evangeline Bayman has been referred for pre-anesthesia review and clearance prior to her undergoing the planned anesthetic and procedural courses. Available labs, pertinent testing, and imaging results were personally reviewed by me. This patient has been appropriately cleared by cardiology with an overall LOW risk of significant perioperative cardiovascular complications.  Based on clinical review performed today (06/10/22), barring any significant acute changes in the patient's overall condition, it is anticipated that she will be able to proceed with the planned surgical intervention. Any acute changes in clinical condition may necessitate her procedure being postponed and/or cancelled. Patient will meet with anesthesia team (MD and/or CRNA) on the day of her procedure for preoperative evaluation/assessment. Questions regarding anesthetic course will be fielded at that time.   Pre-surgical  instructions were reviewed with the patient during her PAT appointment and questions were fielded by PAT clinical staff. Patient was advised that if any questions or concerns arise prior to her procedure then she should return a call to PAT and/or her surgeon's office to discuss.  Honor Loh, MSN, APRN, FNP-C, CEN [facility]  Peri-operative Services Nurse Practitioner Phone: [phone] Fax: [phone] 06/10/22 11:51 AM  NOTE: This note has been prepared using Dragon dictation software. Despite my best ability to proofread, there is always the potential that unintentional transcriptional errors may  still occur from this process.

## 2022-06-10 ENCOUNTER — Encounter: Payer: Self-pay | Admitting: General Surgery

## 2022-06-12 ENCOUNTER — Ambulatory Visit
Admission: RE | Admit: 2022-06-12 | Discharge: 2022-06-12 | Disposition: A | Discharge status: Home / Self Care | Payer: Medicare Other | Source: Ambulatory Visit | Attending: General Surgery | Admitting: General Surgery

## 2022-06-12 ENCOUNTER — Encounter
Admission: RE | Disposition: A | Discharge status: Home / Self Care | Payer: Self-pay | Source: Ambulatory Visit | Attending: General Surgery

## 2022-06-12 ENCOUNTER — Encounter: Payer: Self-pay | Admitting: General Surgery

## 2022-06-12 ENCOUNTER — Ambulatory Visit: Payer: Medicare Other | Admitting: Urgent Care

## 2022-06-12 ENCOUNTER — Other Ambulatory Visit: Payer: Self-pay

## 2022-06-12 DIAGNOSIS — N6092 Unspecified benign mammary dysplasia of left breast: Secondary | ICD-10-CM | POA: Diagnosis present

## 2022-06-12 DIAGNOSIS — I10 Essential (primary) hypertension: Secondary | ICD-10-CM | POA: Insufficient documentation

## 2022-06-12 DIAGNOSIS — K760 Fatty (change of) liver, not elsewhere classified: Secondary | ICD-10-CM | POA: Insufficient documentation

## 2022-06-12 DIAGNOSIS — I2510 Atherosclerotic heart disease of native coronary artery without angina pectoris: Secondary | ICD-10-CM | POA: Insufficient documentation

## 2022-06-12 DIAGNOSIS — M1990 Unspecified osteoarthritis, unspecified site: Secondary | ICD-10-CM | POA: Diagnosis not present

## 2022-06-12 DIAGNOSIS — Z955 Presence of coronary angioplasty implant and graft: Secondary | ICD-10-CM | POA: Insufficient documentation

## 2022-06-12 DIAGNOSIS — D0582 Other specified type of carcinoma in situ of left breast: Secondary | ICD-10-CM | POA: Diagnosis not present

## 2022-06-12 DIAGNOSIS — J45909 Unspecified asthma, uncomplicated: Secondary | ICD-10-CM | POA: Diagnosis not present

## 2022-06-12 HISTORY — PX: BREAST BIOPSY: SHX20

## 2022-06-12 HISTORY — DX: Allergy, unspecified, initial encounter: T78.40XA

## 2022-06-12 HISTORY — DX: Angina pectoris, unspecified: I20.9

## 2022-06-12 HISTORY — DX: Unspecified cataract: H26.9

## 2022-06-12 HISTORY — DX: Unspecified dyspareunia: N94.10

## 2022-06-12 HISTORY — DX: Mammographic calcification found on diagnostic imaging of breast: R92.1

## 2022-06-12 SURGERY — BREAST BIOPSY WITH NEEDLE LOCALIZATION
Anesthesia: General | Laterality: Left

## 2022-06-12 MED ORDER — DROPERIDOL 2.5 MG/ML IJ SOLN: 0.6250 mg | Freq: Once | INTRAMUSCULAR | Status: DC | PRN

## 2022-06-12 MED ORDER — FENTANYL CITRATE (PF) 100 MCG/2ML IJ SOLN
INTRAMUSCULAR | Status: DC | PRN
  Administered 2022-06-12: 25 ug via INTRAVENOUS

## 2022-06-12 MED ORDER — OXYCODONE HCL 5 MG/5ML PO SOLN: 5.0000 mg | Freq: Once | ORAL | Status: AC | PRN

## 2022-06-12 MED ORDER — DEXAMETHASONE SODIUM PHOSPHATE 10 MG/ML IJ SOLN
INTRAMUSCULAR | Status: DC | PRN
  Administered 2022-06-12: 4 mg via INTRAVENOUS

## 2022-06-12 MED ORDER — ORAL CARE MOUTH RINSE: 15.0000 mL | Freq: Once | OROMUCOSAL | Status: AC

## 2022-06-12 MED ORDER — ONDANSETRON HCL 4 MG/2ML IJ SOLN
INTRAMUSCULAR | Status: DC | PRN
  Administered 2022-06-12: 4 mg via INTRAVENOUS

## 2022-06-12 MED ORDER — DEXAMETHASONE SODIUM PHOSPHATE 10 MG/ML IJ SOLN
INTRAMUSCULAR | Status: AC
  Filled 2022-06-12: qty 1

## 2022-06-12 MED ORDER — PROMETHAZINE HCL 25 MG/ML IJ SOLN: 6.2500 mg | INTRAMUSCULAR | Status: DC | PRN

## 2022-06-12 MED ORDER — ONDANSETRON HCL 4 MG/2ML IJ SOLN
INTRAMUSCULAR | Status: AC
  Filled 2022-06-12: qty 2

## 2022-06-12 MED ORDER — HYDROCODONE-ACETAMINOPHEN 5-325 MG PO TABS: 1.0000 | ORAL_TABLET | ORAL | 0 refills | Status: DC | PRN

## 2022-06-12 MED ORDER — OXYCODONE HCL 5 MG PO TABS
ORAL_TABLET | ORAL | Status: AC
  Filled 2022-06-12: qty 1

## 2022-06-12 MED ORDER — CEFAZOLIN SODIUM-DEXTROSE 2-4 GM/100ML-% IV SOLN
INTRAVENOUS | Status: AC
  Filled 2022-06-12: qty 100

## 2022-06-12 MED ORDER — BUPIVACAINE-EPINEPHRINE (PF) 0.5% -1:200000 IJ SOLN
INTRAMUSCULAR | Status: DC | PRN
  Administered 2022-06-12: 10 mL
  Administered 2022-06-12: 20 mL

## 2022-06-12 MED ORDER — FAMOTIDINE 20 MG PO TABS
ORAL_TABLET | ORAL | Status: AC
  Administered 2022-06-12: 20 mg via ORAL
  Filled 2022-06-12: qty 1

## 2022-06-12 MED ORDER — FAMOTIDINE 20 MG PO TABS: 20.0000 mg | ORAL_TABLET | Freq: Once | ORAL | Status: AC

## 2022-06-12 MED ORDER — CHLORHEXIDINE GLUCONATE CLOTH 2 % EX PADS
6.0000 | MEDICATED_PAD | Freq: Once | CUTANEOUS | Status: AC
  Administered 2022-06-12: 2 via TOPICAL

## 2022-06-12 MED ORDER — ACETAMINOPHEN 10 MG/ML IV SOLN: 1000.0000 mg | Freq: Once | INTRAVENOUS | Status: DC | PRN

## 2022-06-12 MED ORDER — CHLORHEXIDINE GLUCONATE CLOTH 2 % EX PADS
6.0000 | MEDICATED_PAD | Freq: Once | CUTANEOUS | Status: AC
  Administered 2022-06-12: 6 via TOPICAL

## 2022-06-12 MED ORDER — CEFAZOLIN SODIUM-DEXTROSE 2-4 GM/100ML-% IV SOLN
2.0000 g | INTRAVENOUS | Status: AC
  Administered 2022-06-12: 2 g via INTRAVENOUS

## 2022-06-12 MED ORDER — PROPOFOL 10 MG/ML IV BOLUS
INTRAVENOUS | Status: DC | PRN
  Administered 2022-06-12: 40 mg via INTRAVENOUS
  Administered 2022-06-12: 140 mg via INTRAVENOUS

## 2022-06-12 MED ORDER — CHLORHEXIDINE GLUCONATE 0.12 % MT SOLN
OROMUCOSAL | Status: AC
  Administered 2022-06-12: 15 mL via OROMUCOSAL
  Filled 2022-06-12: qty 15

## 2022-06-12 MED ORDER — ACETAMINOPHEN 10 MG/ML IV SOLN
INTRAVENOUS | Status: AC
Start: 2022-06-12 — End: ?
  Filled 2022-06-12: qty 100

## 2022-06-12 MED ORDER — LIDOCAINE HCL (CARDIAC) PF 100 MG/5ML IV SOSY
PREFILLED_SYRINGE | INTRAVENOUS | Status: DC | PRN
  Administered 2022-06-12: 60 mg via INTRAVENOUS

## 2022-06-12 MED ORDER — FENTANYL CITRATE (PF) 100 MCG/2ML IJ SOLN: 25.0000 ug | INTRAMUSCULAR | Status: DC | PRN

## 2022-06-12 MED ORDER — PROPOFOL 10 MG/ML IV BOLUS
INTRAVENOUS | Status: AC
  Filled 2022-06-12: qty 20

## 2022-06-12 MED ORDER — STERILE WATER FOR IRRIGATION IR SOLN
Status: DC | PRN
  Administered 2022-06-12: 500 mL

## 2022-06-12 MED ORDER — FENTANYL CITRATE (PF) 100 MCG/2ML IJ SOLN
INTRAMUSCULAR | Status: AC
  Filled 2022-06-12: qty 2

## 2022-06-12 MED ORDER — OXYCODONE HCL 5 MG PO TABS
5.0000 mg | ORAL_TABLET | Freq: Once | ORAL | Status: AC | PRN
  Administered 2022-06-12: 5 mg via ORAL

## 2022-06-12 MED ORDER — EPHEDRINE SULFATE (PRESSORS) 50 MG/ML IJ SOLN
INTRAMUSCULAR | Status: DC | PRN
  Administered 2022-06-12: 10 mg via INTRAVENOUS

## 2022-06-12 MED ORDER — LIDOCAINE HCL (PF) 2 % IJ SOLN
INTRAMUSCULAR | Status: AC
  Filled 2022-06-12: qty 5

## 2022-06-12 MED ORDER — CHLORHEXIDINE GLUCONATE 0.12 % MT SOLN: 15.0000 mL | Freq: Once | OROMUCOSAL | Status: AC

## 2022-06-12 MED ORDER — BUPIVACAINE-EPINEPHRINE (PF) 0.5% -1:200000 IJ SOLN
INTRAMUSCULAR | Status: AC
Start: 2022-06-12 — End: ?
  Filled 2022-06-12: qty 30

## 2022-06-12 MED ORDER — LACTATED RINGERS IV SOLN: INTRAVENOUS | Status: DC

## 2022-06-12 SURGICAL SUPPLY — 43 items
APL PRP STRL LF DISP 70% ISPRP (MISCELLANEOUS) ×2
BLADE BOVIE TIP EXT 4 (BLADE) IMPLANT
BLADE SURG 15 STRL SS SAFETY (BLADE) ×4 IMPLANT
CHLORAPREP W/TINT 26 (MISCELLANEOUS) ×4 IMPLANT
CNTNR SPEC 2.5X3XGRAD LEK (MISCELLANEOUS)
CONT SPEC 4OZ STER OR WHT (MISCELLANEOUS)
CONT SPEC 4OZ STRL OR WHT (MISCELLANEOUS)
CONTAINER SPEC 2.5X3XGRAD LEK (MISCELLANEOUS) IMPLANT
COVER PROBE FLX POLY STRL (MISCELLANEOUS) ×2 IMPLANT
DEVICE DUBIN SPECIMEN MAMMOGRA (MISCELLANEOUS) ×2 IMPLANT
DRAPE LAPAROTOMY 100X77 ABD (DRAPES) ×2 IMPLANT
DRSG GAUZE FLUFF 36X18 (GAUZE/BANDAGES/DRESSINGS) ×2 IMPLANT
DRSG TELFA 3X4 N-ADH STERILE (GAUZE/BANDAGES/DRESSINGS) ×4 IMPLANT
ELECT CAUTERY BLADE TIP 2.5 (TIP) ×2
ELECT REM PT RETURN 9FT ADLT (ELECTROSURGICAL) ×2
ELECTRODE CAUTERY BLDE TIP 2.5 (TIP) ×1 IMPLANT
ELECTRODE REM PT RTRN 9FT ADLT (ELECTROSURGICAL) ×1 IMPLANT
GAUZE 4X4 16PLY ~~LOC~~+RFID DBL (SPONGE) ×2 IMPLANT
GLOVE BIO SURGEON STRL SZ7.5 (GLOVE) ×2 IMPLANT
GLOVE SURG UNDER LTX SZ8 (GLOVE) ×2 IMPLANT
GOWN STRL REUS W/ TWL LRG LVL3 (GOWN DISPOSABLE) ×2 IMPLANT
GOWN STRL REUS W/TWL LRG LVL3 (GOWN DISPOSABLE) ×4
KIT TURNOVER KIT A (KITS) ×2 IMPLANT
LABEL OR SOLS (LABEL) ×2 IMPLANT
MANIFOLD NEPTUNE II (INSTRUMENTS) ×2 IMPLANT
MARGIN MAP 10MM (MISCELLANEOUS) ×2 IMPLANT
NDL HYPO 25X1 1.5 SAFETY (NEEDLE) ×1 IMPLANT
NEEDLE HYPO 22GX1.5 SAFETY (NEEDLE) ×2 IMPLANT
NEEDLE HYPO 25X1 1.5 SAFETY (NEEDLE) ×2 IMPLANT
PACK BASIN MINOR ARMC (MISCELLANEOUS) ×2 IMPLANT
RETRACTOR RING XSMALL (MISCELLANEOUS) IMPLANT
RTRCTR WOUND ALEXIS 13CM XS SH (MISCELLANEOUS)
STRIP CLOSURE SKIN 1/2X4 (GAUZE/BANDAGES/DRESSINGS) ×2 IMPLANT
SUT ETHILON 3-0 FS-10 30 BLK (SUTURE) ×2
SUT VIC AB 2-0 CT1 27 (SUTURE) ×2
SUT VIC AB 2-0 CT1 TAPERPNT 27 (SUTURE) ×1 IMPLANT
SUT VIC AB 4-0 FS2 27 (SUTURE) ×2 IMPLANT
SUTURE EHLN 3-0 FS-10 30 BLK (SUTURE) ×1 IMPLANT
SWABSTK COMLB BENZOIN TINCTURE (MISCELLANEOUS) ×2 IMPLANT
SYR 10ML LL (SYRINGE) ×2 IMPLANT
TAPE TRANSPORE STRL 2 31045 (GAUZE/BANDAGES/DRESSINGS) IMPLANT
WATER STERILE IRR 1000ML POUR (IV SOLUTION) ×2 IMPLANT
WATER STERILE IRR 500ML POUR (IV SOLUTION) ×2 IMPLANT

## 2022-06-12 NOTE — Transfer of Care (Signed)
Immediate Anesthesia Transfer of Care Note  [name]  Procedure(s) Performed: BREAST BIOPSY WITH NEEDLE LOCALIZATION (Left)  Patient Location: PACU  Anesthesia Type:General  Level of Consciousness: awake, alert  and oriented  Airway & Oxygen Therapy: Patient Spontanous Breathing  Post-op Assessment: Report given to RN and Post -op Vital signs reviewed and stable  Post vital signs: Reviewed and stable  Last Vitals:  Vitals Value Taken Time  BP 142/67 06/12/22 1039  Temp 35.8 C 06/12/22 1039  Pulse 77 06/12/22 1041  Resp 16 06/12/22 1041  SpO2 99 % 06/12/22 1041  Vitals shown include unvalidated device data.  Last Pain:  Vitals:   06/12/22 1039  TempSrc:   PainSc: Asleep         Complications: No notable events documented.

## 2022-06-12 NOTE — Op Note (Signed)
Preoperative diagnosis: Atypical ductal hyperplasia of the left breast.  Postoperative diagnosis: Same.  Operative procedure: Left breast excisional biopsy with wire and ultrasound localization.  Operating surgeon: Donnalee Curry, MD.  Anesthesia: General by LMA, Marcaine 0.5% with 1: 200,000's of epinephrine.  Estimated blood loss: Less than 5 cc.  Clinical note: This 71 year old woman had new clustered microcalcifications identified on screening mammogram.  She subsequently underwent stereotactic biopsy with findings of ADH.  She is brought to the operating for planned excision.  Of note she required 2 biopsies to target the area of concern prior to today's wire localization.  Operative note: The patient received Ancef because of anticipated hematoma from the prior biopsies.  SCD stockings for DVT prevention.  Wire localization was nicely done by the radiology department.  The breast was cleansed with ChloraPrep and draped.  Ultrasound was used to identify the tip of the wire in the retroareolar area to minimize dissection.  A circumareolar incision from the 1 to 5 o'clock position was made after instillation of local anesthesia.  Skin was incised sharply remaining dissection electrocautery.  The wire was brought into the surgical field and a 3 x 3 x 3 cm block of tissue excised, orientated and specimen radiograph showed the intact wire tip as well as the previously placed "X" clip.  Confirmation by radiology.  Hemostasis was electrocautery.  The deep tissue was approximated in 2 layers with interrupted 2-0 Vicryl figure-of-eight sutures.  The adipose layer was approximated in similar fashion.  The skin was closed with a running 4-0 Vicryl subcuticular suture.  Benzoin and Steri-Strips followed by Telfa, fluff gauze and a compressive wrap were applied.  Patient tolerated procedure well and was taken to the PACU in stable condition.

## 2022-06-12 NOTE — Anesthesia Preprocedure Evaluation (Addendum)
Anesthesia Evaluation  Patient identified by MRN, date of birth, ID band Patient awake    Reviewed: Allergy & Precautions, NPO status , Patient's Chart, lab work & pertinent test results  Airway Mallampati: II  TM Distance: >3 FB Neck ROM: full    Dental no notable dental hx.    Pulmonary asthma ,    Pulmonary exam normal        Cardiovascular Exercise Tolerance: Good hypertension, + CAD and + Cardiac Stents  Normal cardiovascular exam   LHC/PCI [facility] 04/20/2017 --> 85% pRCA, 85% pOM --> 2.5 x 12 mm Promus Primere DES to pRCA and 3.0 x 20 mm Promus Primere DES to pLCx; b.) LHC 11/06/2021: EF 55-65%, 25% p-mLM, 40% o-pLAD, 30-50% p-mLAD --> med mgmt.   Neuro/Psych negative neurological ROS  negative psych ROS   GI/Hepatic negative GI ROS, Fatty liver   Endo/Other  negative endocrine ROS  Renal/GU negative Renal ROS  negative genitourinary   Musculoskeletal  (+) Arthritis ,   Abdominal Normal abdominal exam  (+)   Peds  Hematology negative hematology ROS (+)   Anesthesia Other Findings atypical ductal hyperplasia  Past Medical History: No date: Allergies No date: Anginal pain (HCC) No date: Arthritis No date: Asthma No date: Breast calcification, left     Comment:  a.) Bx 05/20/2022 --> at least ADH, however features               concerning for DCIS No date: Cataracts, bilateral     Comment:  a.) s/p extraction 2020 No date: Complication of anesthesia     Comment:  a.) delayed emergence following routine colonoscopy 04/20/2017: Coronary artery disease     Comment:  a.) LHC/PCI [facility] 04/20/2017 --> 85% pRCA,               85% pOM --> 2.5 x 12 mm Promus Primere DES to pRCA and               3.0 x 20 mm Promus Primere DES to pLCx; b.) LHC               11/06/2021: EF 55-65%, 25% p-mLM, 40% o-pLAD, 30-50%               p-mLAD --> med mgmt. No date: Dyspareunia in female No date:  Family history of adverse reaction to anesthesia     Comment:  a.) delayed emergence in first degree relative (mother) No date: Fatty liver No date: Heart murmur No date: Hyperlipidemia No date: Hypertension  Past Surgical History: 1975: ABDOMINAL HYSTERECTOMY 04/27/2022: BREAST BIOPSY; Left     Comment:  stereo bx/ x clip/ extremely scant and scattered               detached groups of ductal epithelial cells in a               background of adipose tissue 05/20/2022: BREAST BIOPSY; Left     Comment:  stereo bx, calcs, "COIL" clip-atypical ductal               hyperplasia with associated calcifications.  Background               mammary parenchyma with prior biopsy site change, fat               necrosis, and focal lobular neoplasia.  Negative for               invasive carcinoma.  Concerning  for DCIS. 2001: BREAST LUMPECTOMY; Left 2020: CATARACT EXTRACTION; Bilateral 04/20/2017: CORONARY ANGIOPLASTY WITH STENT PLACEMENT; Left     Comment:  Procedure: CORONARY ANGIOPLASTY WITH STENT PLACEMENT;               Location: [facility]; Surgeon: Leonette Most, MD No date: FOOT SURGERY; Left 11/06/2021: LEFT HEART CATH AND CORONARY ANGIOGRAPHY; N/A     Comment:  Procedure: LEFT HEART CATH AND CORONARY ANGIOGRAPHY;                Surgeon: Alwyn Pea, MD;  Location: [facility] INVASIVE              CV LAB;  Service: Cardiovascular;  Laterality: N/A; 10/28/2017: spine cyst aspiration     Reproductive/Obstetrics negative OB ROS                            Anesthesia Physical Anesthesia Plan  ASA: 3  Anesthesia Plan: General   Post-op Pain Management: Minimal or no pain anticipated   Induction: Intravenous  PONV Risk Score and Plan: 0, 1 and 2 and Ondansetron and Dexamethasone  Airway Management Planned: LMA  Additional Equipment:   Intra-op Plan:   Post-operative Plan: Extubation in OR  Informed Consent: I have reviewed the patients History  and Physical, chart, labs and discussed the procedure including the risks, benefits and alternatives for the proposed anesthesia with the patient or authorized representative who has indicated his/her understanding and acceptance.     Dental Advisory Given  Plan Discussed with: Anesthesiologist, CRNA and Surgeon  Anesthesia Plan Comments:        Anesthesia Quick Evaluation

## 2022-06-12 NOTE — Anesthesia Procedure Notes (Signed)
Procedure Name: LMA Insertion Date/Time: 06/12/2022 9:45 AM  Performed by: Solangel Mcmanaway, CRNAPre-anesthesia Checklist: Patient identified, Patient being monitored, Timeout performed, Emergency Drugs available and Suction available Patient Re-evaluated:Patient Re-evaluated prior to induction Oxygen Delivery Method: Circle system utilized Preoxygenation: Pre-oxygenation with 100% oxygen Induction Type: IV induction Ventilation: Mask ventilation without difficulty LMA: LMA inserted LMA Size: 3.0 Tube type: Oral Number of attempts: 2 (size 4 wouldn't seat properly) Placement Confirmation: positive ETCO2 and breath sounds checked- equal and bilateral Tube secured with: Tape Dental Injury: Teeth and Oropharynx as per pre-operative assessment

## 2022-06-12 NOTE — Anesthesia Postprocedure Evaluation (Signed)
Anesthesia Post Note  [name]  Procedure(s) Performed: BREAST BIOPSY WITH NEEDLE LOCALIZATION (Left)  Patient location during evaluation: PACU Anesthesia Type: General Level of consciousness: awake and alert Pain management: pain level controlled Vital Signs Assessment: post-procedure vital signs reviewed and stable Respiratory status: spontaneous breathing, nonlabored ventilation and respiratory function stable Cardiovascular status: blood pressure returned to baseline and stable Postop Assessment: no apparent nausea or vomiting Anesthetic complications: no   No notable events documented.   Last Vitals:  Vitals:   06/12/22 1100 06/12/22 1132  BP: 137/71 (!) 156/76  Pulse: 75 73  Resp: 13 14  Temp: (!) 36.1 C (!) 36.3 C  SpO2: 98% 98%    Last Pain:  Vitals:   06/12/22 1132  TempSrc: Temporal  PainSc: 1                  Lucianna Ostlund Mitchell Saya Mccoll     

## 2022-06-12 NOTE — Discharge Instructions (Signed)

## 2022-06-12 NOTE — H&P (Signed)
Becky Mitchell [id number] [birth year]     HPI:  Recent left biopsies (x2) for microcalcifications with identification of ADH. For excisional biopsy.   No medications prior to admission.   Allergies  Allergen Reactions   Fire Ant Anaphylaxis   Sulfa Antibiotics Shortness Of Breath and Rash   Bromfenac Other (See Comments)    MADE EYELIDS RED & EYES BLOODSHOT   Loteprednol Etabonate Other (See Comments)    MADE EYELIDS RED & EYES BLOODSHOT    Moxifloxacin Other (See Comments)    EYE DROPS---MADE EYELIDS RED & EYES BLOODSHOT    Latex Dermatitis   Past Medical History:  Diagnosis Date   Allergies    Anginal pain (HCC)    Arthritis    Asthma    Breast calcification, left    a.) Bx 05/20/2022 --> at least ADH, however features concerning for DCIS   Cataracts, bilateral    a.) s/p extraction 2020   Complication of anesthesia    a.) delayed emergence following routine colonoscopy   Coronary artery disease 04/20/2017   a.) LHC/PCI [facility] 04/20/2017 --> 85% pRCA, 85% pOM --> 2.5 x 12 mm Promus Primere DES to pRCA and 3.0 x 20 mm Promus Primere DES to pLCx; b.) LHC 11/06/2021: EF 55-65%, 25% p-mLM, 40% o-pLAD, 30-50% p-mLAD --> med mgmt.   Dyspareunia in female    Family history of adverse reaction to anesthesia    a.) delayed emergence in first degree relative (mother)   Fatty liver    Heart murmur    Hyperlipidemia    Hypertension    Past Surgical History:  Procedure Laterality Date   ABDOMINAL HYSTERECTOMY  1975   BREAST BIOPSY Left 04/27/2022   stereo bx/ x clip/ extremely scant and scattered detached groups of ductal epithelial cells in a background of adipose tissue   BREAST BIOPSY Left 05/20/2022   stereo bx, calcs, "COIL" clip-atypical ductal hyperplasia with associated calcifications.  Background mammary parenchyma with prior biopsy site change, fat necrosis, and focal lobular neoplasia.  Negative for invasive carcinoma.  Concerning for DCIS.    BREAST LUMPECTOMY Left 2001   CATARACT EXTRACTION Bilateral 2020   CORONARY ANGIOPLASTY WITH STENT PLACEMENT Left 04/20/2017   Procedure: CORONARY ANGIOPLASTY WITH STENT PLACEMENT; Location: [facility]; Surgeon: Leonette Most, MD   FOOT SURGERY Left    LEFT HEART CATH AND CORONARY ANGIOGRAPHY N/A 11/06/2021   Procedure: LEFT HEART CATH AND CORONARY ANGIOGRAPHY;  Surgeon: Alwyn Pea, MD;  Location: [facility] INVASIVE CV LAB;  Service: Cardiovascular;  Laterality: N/A;   spine cyst aspiration  10/28/2017   Social History   Socioeconomic History   Marital status: Married    Spouse name: Becky Mitchell   Number of children: 1   Years of education: Not on file   Highest education level: Not on file  Occupational History   Not on file  Tobacco Use   Smoking status: Never    Passive exposure: Past (ex spouse smoked in home 28 years)   Smokeless tobacco: Never  Vaping Use   Vaping Use: Never used  Substance and Sexual Activity   Alcohol use: Yes    Alcohol/week: 5.0 standard drinks of alcohol    Types: 5 Glasses of wine per week    Comment: wine-3-4 times a week   Drug use: Never   Sexual activity: Not on file  Other Topics Concern   Not on file  Social History Narrative   Lives at home with spouse Becky Mitchell  Social Determinants of Health   Financial Resource Strain: Not on file  Food Insecurity: Not on file  Transportation Needs: Not on file  Physical Activity: Not on file  Stress: Not on file  Social Connections: Not on file  Intimate Partner Violence: Not on file   Social History   Social History Narrative   Lives at home with spouse Becky Mitchell      ROS: Negative.     PE: HEENT: Negative. Lungs: Clear. Cardio: Becky Mitchell 06/12/2022   Assessment/Plan: Excisional biopsy of the left breast.

## 2022-06-12 NOTE — Anesthesia Postprocedure Evaluation (Signed)
Anesthesia Post Note  [name]  Procedure(s) Performed: BREAST BIOPSY WITH NEEDLE LOCALIZATION (Left)  Patient location during evaluation: PACU Anesthesia Type: General Level of consciousness: awake and alert Pain management: pain level controlled Vital Signs Assessment: post-procedure vital signs reviewed and stable Respiratory status: spontaneous breathing, nonlabored ventilation and respiratory function stable Cardiovascular status: blood pressure returned to baseline and stable Postop Assessment: no apparent nausea or vomiting Anesthetic complications: no   No notable events documented.   Last Vitals:  Vitals:   06/12/22 1100 06/12/22 1132  BP: 137/71 (!) 156/76  Pulse: 75 73  Resp: 13 14  Temp: (!) 36.1 C (!) 36.3 C  SpO2: 98% 98%    Last Pain:  Vitals:   06/12/22 1132  TempSrc: Temporal  PainSc: 1                  Foye Deer

## 2022-06-15 ENCOUNTER — Other Ambulatory Visit: Payer: Self-pay | Admitting: Pathology

## 2022-06-16 LAB — SURGICAL PATHOLOGY

## 2022-06-23 ENCOUNTER — Other Ambulatory Visit: Payer: Self-pay | Admitting: General Surgery

## 2022-06-23 DIAGNOSIS — N6092 Unspecified benign mammary dysplasia of left breast: Secondary | ICD-10-CM

## 2022-06-23 DIAGNOSIS — D0512 Intraductal carcinoma in situ of left breast: Secondary | ICD-10-CM

## 2022-07-06 ENCOUNTER — Ambulatory Visit
Admission: RE | Admit: 2022-07-06 | Discharge: 2022-07-06 | Disposition: A | Discharge status: Home / Self Care | Payer: Managed Care, Other (non HMO) | Source: Ambulatory Visit | Attending: Radiation Oncology | Admitting: Radiation Oncology

## 2022-07-06 ENCOUNTER — Encounter: Payer: Self-pay | Admitting: Radiation Oncology

## 2022-07-06 VITALS — BP 159/98 | HR 75 | Temp 98.5°F | Resp 16 | Ht 62.5 in | Wt 156.4 lb

## 2022-07-06 DIAGNOSIS — I2510 Atherosclerotic heart disease of native coronary artery without angina pectoris: Secondary | ICD-10-CM | POA: Insufficient documentation

## 2022-07-06 DIAGNOSIS — K760 Fatty (change of) liver, not elsewhere classified: Secondary | ICD-10-CM | POA: Insufficient documentation

## 2022-07-06 DIAGNOSIS — N6489 Other specified disorders of breast: Secondary | ICD-10-CM | POA: Diagnosis not present

## 2022-07-06 DIAGNOSIS — Z79899 Other long term (current) drug therapy: Secondary | ICD-10-CM | POA: Insufficient documentation

## 2022-07-06 DIAGNOSIS — E785 Hyperlipidemia, unspecified: Secondary | ICD-10-CM | POA: Diagnosis not present

## 2022-07-06 DIAGNOSIS — Z7982 Long term (current) use of aspirin: Secondary | ICD-10-CM | POA: Diagnosis not present

## 2022-07-06 DIAGNOSIS — J45909 Unspecified asthma, uncomplicated: Secondary | ICD-10-CM | POA: Insufficient documentation

## 2022-07-06 DIAGNOSIS — D0512 Intraductal carcinoma in situ of left breast: Secondary | ICD-10-CM

## 2022-07-06 DIAGNOSIS — Z170 Estrogen receptor positive status [ER+]: Secondary | ICD-10-CM | POA: Diagnosis not present

## 2022-07-06 DIAGNOSIS — R011 Cardiac murmur, unspecified: Secondary | ICD-10-CM | POA: Insufficient documentation

## 2022-07-06 DIAGNOSIS — I10 Essential (primary) hypertension: Secondary | ICD-10-CM | POA: Insufficient documentation

## 2022-07-06 NOTE — Consult Note (Signed)
NEW PATIENT EVALUATION  Name: Becky Mitchell  MRN: [id number]  Date:   07/06/2022     DOB: 08-Mar-1951   This 71 y.o. 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   PREVIOUS INVESTIGATIONS:  Mammogram and ultrasound reviewed Clinical notes reviewed Pathology reports reviewed  HPI: Patient is a 71 year old female who presented with back in May with an abnormal mammogram showing a 4 mm group of calcifications in the anterior 3 o'clock position of the left breast.  DCIS cannot be ruled out.  She underwent stereotactic guided biopsy which was positive for atypical ductal hyperplasia.  She went on to have a wide local excision of the left breast showing nuclear grade 2 ductal carcinoma in situ margins were negative for DCIS although closest margin at the posterior was 0.7 mm.  Tumor expanded at least 6 mm.  No lymph nodes were submitted.  Tumor was ER positive.  She is developed a small seroma in the area of incision excision although otherwise is tolerating her postoperative course well.  She specifically denies cough or bone pain.  PLANNED TREATMENT REGIMEN: Hypofractionated left whole breast radiation with electron boost  PAST MEDICAL HISTORY:  has a past medical history of Allergies, Anginal pain (HCC), Arthritis, Asthma, Breast calcification, left, Cataracts, bilateral, Complication of anesthesia, Coronary artery disease (04/20/2017), Dyspareunia in female, Family history of adverse reaction to anesthesia, Fatty liver, Heart murmur, Hyperlipidemia, and Hypertension.    PAST SURGICAL HISTORY:  Past Surgical History:  Procedure Laterality Date    ABDOMINAL HYSTERECTOMY  1975   BREAST BIOPSY Left 04/27/2022   stereo bx/ x clip/ extremely scant and scattered detached groups of ductal epithelial cells in a background of adipose tissue   BREAST BIOPSY Left 05/20/2022   stereo bx, calcs, "COIL" clip-atypical ductal hyperplasia with associated calcifications.  Background mammary parenchyma with prior biopsy site change, fat necrosis, and focal lobular neoplasia.  Negative for invasive carcinoma.  Concerning for DCIS.   BREAST BIOPSY Left 06/12/2022   Procedure: BREAST BIOPSY WITH NEEDLE LOCALIZATION;  Surgeon: Earline Mayotte, MD;  Location: [facility];  Service: General;  Laterality: Left;   BREAST LUMPECTOMY Left 2001   CATARACT EXTRACTION Bilateral 2020   CORONARY ANGIOPLASTY WITH STENT PLACEMENT Left 04/20/2017   Procedure: CORONARY ANGIOPLASTY WITH STENT PLACEMENT; Location: [facility]; Surgeon: Leonette Most, MD   FOOT SURGERY Left    LEFT HEART CATH AND CORONARY ANGIOGRAPHY N/A 11/06/2021   Procedure: LEFT HEART CATH AND CORONARY ANGIOGRAPHY;  Surgeon: Alwyn Pea, MD;  Location: [facility] CV LAB;  Service: Cardiovascular;  Laterality: N/A;   spine cyst aspiration  10/28/2017    FAMILY HISTORY: family history includes Cancer in her brother.  SOCIAL HISTORY:  reports that she has never smoked. She has been exposed to tobacco smoke. She has never used smokeless tobacco. She reports current alcohol use of about 5.0 standard drinks of alcohol per week. She reports that she does not use drugs.  ALLERGIES: Fire ant, Sulfa antibiotics, Bromfenac, Loteprednol etabonate, Moxifloxacin, Latex, and Tape  MEDICATIONS:  Current Outpatient Medications  Medication Sig Dispense Refill   aspirin EC 81 MG tablet Take 81  mg by mouth every evening. Swallow whole.     Calcium Carbonate (CALCIUM 600 PO) Take 1 tablet by mouth in the morning.     Cholecalciferol (VITAMIN D3 PO) Take 1 tablet by mouth in the morning.     coconut oil  OIL Apply 1 application topically every other day. AT NIGHT (Applied to feet)     Coenzyme Q10-Vitamin E (COQ10 ST-100 PO) Take 100 mg by mouth every evening.     EPINEPHrine 0.3 mg/0.3 mL IJ SOAJ injection Inject 0.3 mg into the muscle as needed for anaphylaxis.     ezetimibe (ZETIA) 10 MG tablet Take 10 mg by mouth in the morning.     isosorbide mononitrate (IMDUR) 30 MG 24 hr tablet Take 30 mg by mouth in the morning.     losartan (COZAAR) 50 MG tablet Take 50 mg by mouth in the morning.     metoprolol succinate (TOPROL-XL) 50 MG 24 hr tablet Take 50 mg by mouth every evening. Take with or immediately following a meal.     potassium citrate (UROCIT-K) 10 MEQ (1080 MG) SR tablet Take 10 mEq by mouth in the morning.     rosuvastatin (CRESTOR) 40 MG tablet Take 40 mg by mouth at bedtime.     albuterol (VENTOLIN HFA) 108 (90 Base) MCG/ACT inhaler Inhale 1-2 puffs into the lungs every 6 (six) hours as needed for wheezing or shortness of breath (HAS NOT USED SINCE 2021). (Patient not taking: Reported on 07/06/2022)     No current facility-administered medications for this encounter.    ECOG PERFORMANCE STATUS:  0 - Asymptomatic  REVIEW OF SYSTEMS: Patient denies any weight loss, fatigue, weakness, fever, chills or night sweats. Patient denies any loss of vision, blurred vision. Patient denies any ringing  of the ears or hearing loss. No irregular heartbeat. Patient denies heart murmur or history of fainting. Patient denies any chest pain or pain radiating to her upper extremities. Patient denies any shortness of breath, difficulty breathing at night, cough or hemoptysis. Patient denies any swelling in the lower legs. Patient denies any nausea vomiting, vomiting of blood, or coffee ground material in the vomitus. Patient denies any stomach pain. Patient states has had normal bowel movements no significant constipation or diarrhea. Patient denies any dysuria, hematuria or significant nocturia. Patient  denies any problems walking, swelling in the joints or loss of balance. Patient denies any skin changes, loss of hair or loss of weight. Patient denies any excessive worrying or anxiety or significant depression. Patient denies any problems with insomnia. Patient denies excessive thirst, polyuria, polydipsia. Patient denies any swollen glands, patient denies easy bruising or easy bleeding. Patient denies any recent infections, allergies or URI. Patient "s visual fields have not changed significantly in recent time.   PHYSICAL EXAM: BP (!) 159/98 (BP Location: Right Arm, Patient Position: Sitting, Cuff Size: Normal)   Pulse 75   Temp 98.5 F (36.9 C) (Tympanic)   Resp 16   Ht 5' 2.5" (1.588 m) Comment: Stated Ht  Wt 156 lb 6.4 oz (70.9 kg)   BMI 28.15 kg/m  Patient status post wide local excision of the left breast.  There is a small seroma present at the lumpectomy site.  No other dominant masses noted in either breast no axillary or supraclavicular adenopathy is appreciated.  Well-developed well-nourished patient in NAD. HEENT reveals PERLA, EOMI, discs not visualized.  Oral cavity is clear. No oral mucosal lesions are identified. Neck is clear without evidence of cervical  or supraclavicular adenopathy. Lungs are clear to A&P. Cardiac examination is essentially unremarkable with regular rate and rhythm without murmur rub or thrill. Abdomen is benign with no organomegaly or masses noted. Motor sensory and DTR levels are equal and symmetric in the upper and lower extremities. Cranial nerves II through XII are grossly intact. Proprioception is intact. No peripheral adenopathy or edema is identified. No motor or sensory levels are noted. Crude visual fields are within normal range.  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  I would plan on delivering hypofractionated course of whole breast radiation over 3 weeks.  I would also boost her scar another 1600 cGy using electron beam based on the close less than 1 mm margin.  Risks and benefits of treatment including skin reaction fatigue alteration of blood counts possible inclusion of superficial lung were all reviewed with the patient.  We will use breath-hold since this is a left sided lesion to try to minimize cardiac exposure.  Patient and husband both comprehend our treatment plan well.  I would like to take this opportunity to thank you for allowing me to participate in the care of your patient.Noreene Filbert, MD

## 2022-07-08 ENCOUNTER — Ambulatory Visit
Admission: RE | Admit: 2022-07-08 | Discharge: 2022-07-08 | Disposition: A | Discharge status: Home / Self Care | Payer: Managed Care, Other (non HMO) | Source: Ambulatory Visit | Attending: Radiation Oncology | Admitting: Radiation Oncology

## 2022-07-08 DIAGNOSIS — D0512 Intraductal carcinoma in situ of left breast: Secondary | ICD-10-CM | POA: Diagnosis present

## 2022-07-09 ENCOUNTER — Inpatient Hospital Stay: Payer: Managed Care, Other (non HMO) | Attending: Oncology | Admitting: Licensed Clinical Social Worker

## 2022-07-09 ENCOUNTER — Inpatient Hospital Stay: Payer: Managed Care, Other (non HMO)

## 2022-07-09 ENCOUNTER — Encounter: Payer: Self-pay | Admitting: Licensed Clinical Social Worker

## 2022-07-09 DIAGNOSIS — D0512 Intraductal carcinoma in situ of left breast: Secondary | ICD-10-CM

## 2022-07-09 DIAGNOSIS — Z800 Family history of malignant neoplasm of digestive organs: Secondary | ICD-10-CM

## 2022-07-09 DIAGNOSIS — Z8042 Family history of malignant neoplasm of prostate: Secondary | ICD-10-CM

## 2022-07-09 DIAGNOSIS — Z803 Family history of malignant neoplasm of breast: Secondary | ICD-10-CM | POA: Diagnosis not present

## 2022-07-09 NOTE — Progress Notes (Signed)
REFERRING PROVIDER: Robert Bellow, MD [place],  [place] [address]  PRIMARY PROVIDER:  Gladstone Lighter, MD  PRIMARY REASON FOR VISIT:  1. Ductal carcinoma in situ (DCIS) of left breast   2. Family history of breast cancer   3. Family history of prostate cancer   4. Family history of pancreatic cancer      HISTORY OF PRESENT ILLNESS:   Becky Mitchell, a 71 y.o. female, was seen for a [place] cancer genetics consultation at the request of Dr. Bary Castilla due to a personal and family history of breast cancer.  Becky Mitchell presents to clinic today to discuss the possibility of a hereditary predisposition to cancer, genetic testing, and to further clarify her future cancer risks, as well as potential cancer risks for family members.   CANCER HISTORY:  In 2023, at the age of 67, Becky Mitchell was diagnosed with DCIS of the left breast. The treatment plan includes surgery and radiation.    RISK FACTORS:  Menarche was at age 34.  First live birth at age 69.  OCP use for approximately 10 years.  Ovaries intact: yes.  Hysterectomy: yes.  Menopausal status: postmenopausal.  HRT use:  20  years. Colonoscopy: yes;  reports fewer than 10 polyps . Mammogram within the last year: yes. Number of breast biopsies: 2.  Past Medical History:  Diagnosis Date   Allergies    Anginal pain ([place])    Arthritis    Asthma    Breast calcification, left    a.) Bx 05/20/2022 --> at least ADH, however features concerning for DCIS   Cataracts, bilateral    a.) s/p extraction 2549   Complication of anesthesia    a.) delayed emergence following routine colonoscopy   Coronary artery disease 04/20/2017   a.) LHC/PCI [place] 04/20/2017 --> 85% pRCA, 85% pOM --> 2.5 x 12 mm Promus Primere DES to pRCA and 3.0 x 20 mm Promus Primere DES to pLCx; b.) LHC 11/06/2021: EF 55-65%, 25% p-mLM, 40% o-pLAD, 30-50% p-mLAD --> med mgmt.   Dyspareunia in female    Family history of adverse reaction  to anesthesia    a.) delayed emergence in first degree relative (mother)   Fatty liver    Heart murmur    Hyperlipidemia    Hypertension     Past Surgical History:  Procedure Laterality Date   ABDOMINAL HYSTERECTOMY  1975   BREAST BIOPSY Left 04/27/2022   stereo bx/ x clip/ extremely scant and scattered detached groups of ductal epithelial cells in a background of adipose tissue   BREAST BIOPSY Left 05/20/2022   stereo bx, calcs, "COIL" clip-atypical ductal hyperplasia with associated calcifications.  Background mammary parenchyma with prior biopsy site change, fat necrosis, and focal lobular neoplasia.  Negative for invasive carcinoma.  Concerning for DCIS.   BREAST BIOPSY Left 06/12/2022   Procedure: BREAST BIOPSY WITH NEEDLE LOCALIZATION;  Surgeon: Robert Bellow, MD;  Location: [facility];  Service: General;  Laterality: Left;   BREAST LUMPECTOMY Left 2001   CATARACT EXTRACTION Bilateral 2020   CORONARY ANGIOPLASTY WITH STENT PLACEMENT Left 04/20/2017   Procedure: CORONARY ANGIOPLASTY WITH STENT PLACEMENT; Location: [place]; Surgeon: Ola Spurr, MD   FOOT SURGERY Left    LEFT HEART CATH AND CORONARY ANGIOGRAPHY N/A 11/06/2021   Procedure: LEFT HEART CATH AND CORONARY ANGIOGRAPHY;  Surgeon: Yolonda Kida, MD;  Location: [place] [facility];  Service: Cardiovascular;  Laterality: N/A;   spine cyst aspiration  10/28/2017    FAMILY HISTORY:  We obtained a detailed, 4-generation family history.  Significant diagnoses are listed below: Family History  Problem Relation Age of Onset   Skin cancer Mother    Prostate cancer Brother        dx 5s   Lung cancer Brother    Pancreatic cancer Paternal Aunt    Lung cancer Paternal Uncle    Brain cancer Maternal Grandmother 79   Breast cancer Other    Breast cancer Cousin        dx 73s   Becky Mitchell has 1 daughter, 70 and 1 granddaughter, 51. She has 2 sisters, 1 brother. Her brother had prostate and lung cancer in his  36s.  Becky Mitchell mother has had skin cancer, no other cancer history. Maternal grandmother had brain cancer in her late 50s and her sister, patient's great aunt, had breast cancer.  Becky Mitchell father died at 61 with no cancer history. Patient had 12 paternal aunts/uncles. One aunt had pancreatic cancer, one uncle had lung cancer. Paternal grandfather's brother's granddaughter (patient's second cousin) had breast cancer in her 43s.   Ms. Kasik is unaware of previous family history of genetic testing for hereditary cancer risks. There is no reported Ashkenazi Jewish ancestry. There is no known consanguinity.    GENETIC COUNSELING ASSESSMENT: Becky Mitchell is a 71 y.o. female with a personal and family history which is somewhat suggestive of a hereditary cancer syndrome and predisposition to cancer. We, therefore, discussed and recommended the following at today's visit.   DISCUSSION: We discussed that approximately 10% of breast cancer is hereditary. Most cases of hereditary breast/pancreatic/prostate cancer are associated with BRCA1/BRCA2 genes, although there are other genes associated with hereditary  cancer as well. Cancers and risks are gene specific. We discussed that testing is beneficial for several reasons including knowing about cancer risks, identifying potential screening and risk-reduction options that may be appropriate, and to understand if other family members could be at risk for cancer and allow them to undergo genetic testing.   We reviewed the characteristics, features and inheritance patterns of hereditary cancer syndromes. We also discussed genetic testing, including the appropriate family members to test, the process of testing, insurance coverage and turn-around-time for results. We discussed the implications of a negative, positive and/or variant of uncertain significant result. We recommended Becky Mitchell pursue genetic testing for the [employer] CancerNext-Expanded+RNA gene panel.    Based on Becky Mitchell's personal and family history of cancer, she meets medical criteria for genetic testing. Despite that she meets criteria, she may still have an out of pocket cost. We discussed that if her out of pocket cost for testing is over $100, the laboratory will call and confirm whether she wants to proceed with testing.  If the out of pocket cost of testing is less than $100 she will be billed by the genetic testing laboratory.   PLAN: After considering the risks, benefits, and limitations, Ms. Cassel provided informed consent to pursue genetic testing and the blood sample was sent to [facility] for analysis of the CancerNext-Expanded+RNA panel. Results should be available within approximately 2-3 weeks' time, at which point they will be disclosed by telephone to Ms. Hilario, as will any additional recommendations warranted by these results. Ms. Willetts will receive a summary of her genetic counseling visit and a copy of her results once available. This information will also be available in Epic.   Ms. Bickle questions were answered to her satisfaction today. Our contact  information was provided should additional questions or concerns arise. Thank you for the referral and allowing [place] to share in the care of your patient.   Faith Rogue, MS, [facility] Genetic Counselor [email or web address] Phone: [phone]  The patient was seen for a total of 25 minutes in face-to-face genetic counseling.  Dr. Grayland Ormond was available for discussion regarding this case.   _______________________________________________________________________ For Office Staff:  Number of people involved in session: 1 Was an Intern/ student involved with case: no

## 2022-07-10 ENCOUNTER — Other Ambulatory Visit: Payer: Self-pay | Admitting: *Deleted

## 2022-07-10 DIAGNOSIS — D0512 Intraductal carcinoma in situ of left breast: Secondary | ICD-10-CM

## 2022-07-13 DIAGNOSIS — D0512 Intraductal carcinoma in situ of left breast: Secondary | ICD-10-CM | POA: Diagnosis not present

## 2022-07-16 ENCOUNTER — Ambulatory Visit
Admission: RE | Admit: 2022-07-16 | Discharge: 2022-07-16 | Disposition: A | Discharge status: Home / Self Care | Payer: Managed Care, Other (non HMO) | Source: Ambulatory Visit | Attending: Radiation Oncology | Admitting: Radiation Oncology

## 2022-07-16 DIAGNOSIS — D0512 Intraductal carcinoma in situ of left breast: Secondary | ICD-10-CM | POA: Diagnosis not present

## 2022-07-20 DIAGNOSIS — D0512 Intraductal carcinoma in situ of left breast: Secondary | ICD-10-CM | POA: Insufficient documentation

## 2022-07-20 DIAGNOSIS — Z510 Encounter for antineoplastic radiation therapy: Secondary | ICD-10-CM | POA: Insufficient documentation

## 2022-07-21 ENCOUNTER — Telehealth: Payer: Self-pay | Admitting: Licensed Clinical Social Worker

## 2022-07-21 ENCOUNTER — Ambulatory Visit: Payer: Self-pay | Admitting: Licensed Clinical Social Worker

## 2022-07-21 ENCOUNTER — Ambulatory Visit
Admission: RE | Admit: 2022-07-21 | Discharge: 2022-07-21 | Disposition: A | Discharge status: Home / Self Care | Payer: Managed Care, Other (non HMO) | Source: Ambulatory Visit | Attending: Radiation Oncology | Admitting: Radiation Oncology

## 2022-07-21 ENCOUNTER — Other Ambulatory Visit: Payer: Self-pay

## 2022-07-21 ENCOUNTER — Encounter: Payer: Self-pay | Admitting: Licensed Clinical Social Worker

## 2022-07-21 DIAGNOSIS — Z1379 Encounter for other screening for genetic and chromosomal anomalies: Secondary | ICD-10-CM

## 2022-07-21 DIAGNOSIS — Z510 Encounter for antineoplastic radiation therapy: Secondary | ICD-10-CM | POA: Diagnosis present

## 2022-07-21 DIAGNOSIS — D0512 Intraductal carcinoma in situ of left breast: Secondary | ICD-10-CM | POA: Insufficient documentation

## 2022-07-21 LAB — RAD ONC ARIA SESSION SUMMARY
Course Elapsed Days: 0
Plan Fractions Treated to Date: 1
Plan Prescribed Dose Per Fraction: 2.66 Gy
Plan Total Fractions Prescribed: 16
Plan Total Prescribed Dose: 42.56 Gy
Reference Point Dosage Given to Date: 2.66 Gy
Reference Point Session Dosage Given: 2.66 Gy
Session Number: 1

## 2022-07-21 NOTE — Progress Notes (Signed)
HPI:  Becky Mitchell was previously seen in the [place] clinic due to a personal and family history of cancer and concerns regarding a hereditary predisposition to cancer. Please refer to our prior cancer genetics clinic note for more information regarding our discussion, assessment and recommendations, at the time. Becky Mitchell recent genetic test results were disclosed to her, as were recommendations warranted by these results. These results and recommendations are discussed in more detail below.  CANCER HISTORY:  Oncology History   No history exists.    FAMILY HISTORY:  We obtained a detailed, 4-generation family history.  Significant diagnoses are listed below: Family History  Problem Relation Age of Onset   Skin cancer Mother    Prostate cancer Brother        dx 17s   Lung cancer Brother    Pancreatic cancer Paternal Aunt    Lung cancer Paternal Uncle    Brain cancer Maternal Grandmother 79   Breast cancer Other    Breast cancer Cousin        dx 38s   Becky Mitchell has 1 daughter, 30 and 1 granddaughter, 69. She has 2 sisters, 1 brother. Her brother had prostate and lung cancer in his 65s.   Becky Mitchell mother has had skin cancer, no other cancer history. Maternal grandmother had brain cancer in her late 62s and her sister, patient's great aunt, had breast cancer.   Becky Mitchell father died at 79 with no cancer history. Patient had 12 paternal aunts/uncles. One aunt had pancreatic cancer, one uncle had lung cancer. Paternal grandfather's brother's granddaughter (patient's second cousin) had breast cancer in her 69s.    Becky Mitchell is unaware of previous family history of genetic testing for hereditary cancer risks. There is no reported Ashkenazi Jewish ancestry. There is no known consanguinity.     GENETIC TEST RESULTS: Genetic testing reported out on 07/21/2022 through the [facility] CancerNext-Expanded+RNA cancer panel found no pathogenic mutations.   The CancerNext-Expanded  + RNAinsight gene panel offered by [employer] and includes sequencing and rearrangement analysis for the following 77 genes: IP, ALK, APC*, ATM*, AXIN2, BAP1, BARD1, BLM, BMPR1A, BRCA1*, BRCA2*, BRIP1*, CDC73, CDH1*,CDK4, CDKN1B, CDKN2A, CHEK2*, CTNNA1, DICER1, FANCC, FH, FLCN, GALNT12, KIF1B, LZTR1, MAX, MEN1, MET, MLH1*, MSH2*, MSH3, MSH6*, MUTYH*, NBN, NF1*, NF2, NTHL1, PALB2*, PHOX2B, PMS2*, POT1, PRKAR1A, PTCH1, PTEN*, RAD51C*, RAD51D*,RB1, RECQL, RET, SDHA, SDHAF2, SDHB, SDHC, SDHD, SMAD4, SMARCA4, SMARCB1, SMARCE1, STK11, SUFU, TMEM127, TP53*,TSC1, TSC2, VHL and XRCC2 (sequencing and deletion/duplication); EGFR, EGLN1, HOXB13, KIT, MITF, PDGFRA, POLD1 and POLE (sequencing only); EPCAM and GREM1 (deletion/duplication only).   The test report has been scanned into EPIC and is located under the Molecular Pathology section of the Results Review tab.  A portion of the result report is included below for reference.     We discussed that because current genetic testing is not perfect, it is possible there may be a gene mutation in one of these genes that current testing cannot detect, but that chance is small.  There could be another gene that has not yet been discovered, or that we have not yet tested, that is responsible for the cancer diagnoses in the family. It is also possible there is a hereditary cause for the cancer in the family that Becky Mitchell did not inherit and therefore was not identified in her testing.  Therefore, it is important to remain in touch with cancer genetics in the future so that we can continue to offer [name] the  most up to date genetic testing.   ADDITIONAL GENETIC TESTING: We discussed with Becky Mitchell that her genetic testing was fairly extensive.  If there are genes identified to increase cancer risk that can be analyzed in the future, we would be happy to discuss and coordinate this testing at that time.    CANCER SCREENING RECOMMENDATIONS: Becky Mitchell test result is  considered negative (normal).  This means that we have not identified a hereditary cause for her  personal and family history of cancer at this time. Most cancers happen by chance and this negative test suggests that her cancer may fall into this category.    While reassuring, this does not definitively rule out a hereditary predisposition to cancer. It is still possible that there could be genetic mutations that are undetectable by current technology. There could be genetic mutations in genes that have not been tested or identified to increase cancer risk.  Therefore, it is recommended she continue to follow the cancer management and screening guidelines provided by her oncology and primary healthcare provider.   An individual's cancer risk and medical management are not determined by genetic test results alone. Overall cancer risk assessment incorporates additional factors, including personal medical history, family history, and any available genetic information that may result in a personalized plan for cancer prevention and surveillance.  RECOMMENDATIONS FOR FAMILY MEMBERS:  Relatives in this family might be at some increased risk of developing cancer, over the general population risk, simply due to the family history of cancer.  We recommended female relatives in this family have a yearly mammogram beginning at age 72, or 63 years younger than the earliest onset of cancer, an annual clinical breast exam, and perform monthly breast self-exams. 72 Female relatives in this family should also have a gynecological exam as recommended by their primary provider.  All family members should be referred for colonoscopy starting at age 67.    It is also possible there is a hereditary cause for the cancer in Becky Mitchell's family that she did not inherit and therefore was not identified in her.  Based on Becky Mitchell's family history, we recommended paternal relatives (especially those who have had cancer) have genetic  counseling and testing. Becky Mitchell will let us know if we can be of any assistance in coordinating genetic counseling and/or testing for these family members.  FOLLOW-UP: Lastly, we discussed with Ms. Laningham that cancer genetics is a rapidly advancing field and it is possible that new genetic tests will be appropriate for her and/or her family members in the future. We encouraged her to remain in contact with cancer genetics on an annual basis so we can update her personal and family histories and let her know of advances in cancer genetics that may benefit this family.   Our contact number was provided. Ms. Meixner questions were answered to her satisfaction, and she knows she is welcome to call us at anytime with additional questions or concerns.   Faith Rogue, MS, [facility] Genetic Counselor [email or web address] Phone: [phone]

## 2022-07-21 NOTE — Telephone Encounter (Signed)
I contacted Ms. Kimmet to discuss her genetic testing results. No pathogenic variants were identified in the 77 genes analyzed. Detailed clinic note to follow.   The test report has been scanned into EPIC and is located under the Molecular Pathology section of the Results Review tab.  A portion of the result report is included below for reference.      Lacy Duverney, MS, [facility] Genetic Counselor [email or web address] Phone: [phone]

## 2022-07-22 ENCOUNTER — Ambulatory Visit
Admission: RE | Admit: 2022-07-22 | Discharge: 2022-07-22 | Disposition: A | Discharge status: Home / Self Care | Payer: Managed Care, Other (non HMO) | Source: Ambulatory Visit | Attending: Radiation Oncology | Admitting: Radiation Oncology

## 2022-07-22 ENCOUNTER — Other Ambulatory Visit: Payer: Self-pay

## 2022-07-22 ENCOUNTER — Emergency Department: Payer: Managed Care, Other (non HMO)

## 2022-07-22 ENCOUNTER — Encounter: Payer: Self-pay | Admitting: Emergency Medicine

## 2022-07-22 ENCOUNTER — Emergency Department
Admission: EM | Admit: 2022-07-22 | Discharge: 2022-07-22 | Disposition: A | Discharge status: Home / Self Care | Payer: Managed Care, Other (non HMO) | Attending: Emergency Medicine | Admitting: Emergency Medicine

## 2022-07-22 DIAGNOSIS — Z510 Encounter for antineoplastic radiation therapy: Secondary | ICD-10-CM | POA: Diagnosis not present

## 2022-07-22 DIAGNOSIS — Z853 Personal history of malignant neoplasm of breast: Secondary | ICD-10-CM | POA: Insufficient documentation

## 2022-07-22 DIAGNOSIS — T7840XA Allergy, unspecified, initial encounter: Secondary | ICD-10-CM | POA: Diagnosis not present

## 2022-07-22 DIAGNOSIS — R569 Unspecified convulsions: Secondary | ICD-10-CM | POA: Diagnosis not present

## 2022-07-22 DIAGNOSIS — L299 Pruritus, unspecified: Secondary | ICD-10-CM | POA: Diagnosis present

## 2022-07-22 LAB — CBC WITH DIFFERENTIAL/PLATELET
Abs Immature Granulocytes: 0.01 10*3/uL (ref 0.00–0.07)
Basophils Absolute: 0 10*3/uL (ref 0.0–0.1)
Basophils Relative: 1 %
Eosinophils Absolute: 0.2 10*3/uL (ref 0.0–0.5)
Eosinophils Relative: 3 %
HCT: 42.1 % (ref 36.0–46.0)
Hemoglobin: 14.6 g/dL (ref 12.0–15.0)
Immature Granulocytes: 0 %
Lymphocytes Relative: 28 %
Lymphs Abs: 1.5 10*3/uL (ref 0.7–4.0)
MCH: 31 pg (ref 26.0–34.0)
MCHC: 34.7 g/dL (ref 30.0–36.0)
MCV: 89.4 fL (ref 80.0–100.0)
Monocytes Absolute: 0.6 10*3/uL (ref 0.1–1.0)
Monocytes Relative: 11 %
Neutro Abs: 3.2 10*3/uL (ref 1.7–7.7)
Neutrophils Relative %: 57 %
Platelets: 174 10*3/uL (ref 150–400)
RBC: 4.71 MIL/uL (ref 3.87–5.11)
RDW: 12.2 % (ref 11.5–15.5)
WBC: 5.5 10*3/uL (ref 4.0–10.5)
nRBC: 0 % (ref 0.0–0.2)

## 2022-07-22 LAB — RAD ONC ARIA SESSION SUMMARY
Course Elapsed Days: 1
Plan Fractions Treated to Date: 2
Plan Prescribed Dose Per Fraction: 2.66 Gy
Plan Total Fractions Prescribed: 16
Plan Total Prescribed Dose: 42.56 Gy
Reference Point Dosage Given to Date: 5.32 Gy
Reference Point Session Dosage Given: 2.66 Gy
Session Number: 2

## 2022-07-22 LAB — COMPREHENSIVE METABOLIC PANEL
ALT: 29 U/L (ref 0–44)
AST: 39 U/L (ref 15–41)
Albumin: 4.4 g/dL (ref 3.5–5.0)
Alkaline Phosphatase: 128 U/L — ABNORMAL HIGH (ref 38–126)
Anion gap: 9 (ref 5–15)
BUN: 18 mg/dL (ref 8–23)
CO2: 25 mmol/L (ref 22–32)
Calcium: 9.6 mg/dL (ref 8.9–10.3)
Chloride: 107 mmol/L (ref 98–111)
Creatinine, Ser: 0.84 mg/dL (ref 0.44–1.00)
GFR, Estimated: >60 mL/min (ref >60)
Glucose, Bld: 101 mg/dL — ABNORMAL HIGH (ref 70–99)
Potassium: 3.7 mmol/L (ref 3.5–5.1)
Sodium: 141 mmol/L (ref 135–145)
Total Bilirubin: 1.1 mg/dL (ref 0.3–1.2)
Total Protein: 7.1 g/dL (ref 6.5–8.1)

## 2022-07-22 MED ORDER — METHYLPREDNISOLONE SODIUM SUCC 125 MG IJ SOLR
125.0000 mg | Freq: Once | INTRAMUSCULAR | Status: AC
  Administered 2022-07-22: 125 mg via INTRAVENOUS
  Filled 2022-07-22: qty 2

## 2022-07-22 MED ORDER — DIPHENHYDRAMINE HCL 50 MG/ML IJ SOLN
25.0000 mg | Freq: Once | INTRAMUSCULAR | Status: AC
  Administered 2022-07-22: 25 mg via INTRAVENOUS
  Filled 2022-07-22: qty 1

## 2022-07-22 MED ORDER — FAMOTIDINE IN NACL 20-0.9 MG/50ML-% IV SOLN
20.0000 mg | Freq: Once | INTRAVENOUS | Status: AC
  Administered 2022-07-22: 20 mg via INTRAVENOUS
  Filled 2022-07-22: qty 50

## 2022-07-22 NOTE — ED Notes (Signed)
Pt flaked out while giving meds, directly after the steroid. Pt just went blank, staring off. Sternal rub did nothing. PA came to the room

## 2022-07-22 NOTE — ED Provider Notes (Signed)
[facility] Provider Note    Event Date/Time   First MD Initiated Contact with Patient 07/22/22 1226     (approximate)   History   Allergic Reaction   HPI  Shiori Ivyana Locey is a 71 y.o. female with history of breast cancer currently undergoing radiation who comes in with concerns for fire ant bite.  Patient reports being bit by fire ants on her middle toe of her left foot and developing some itching.  She reports history of anaphylaxis 10 years ago but typically if she gets the medications including Benadryl prior to that she does not need the epinephrine.  She denies any difficulty swallowing chest pain or shortness of breath.    Physical Exam   Triage Vital Signs: ED Triage Vitals  Enc Vitals Group     BP 07/22/22 1215 (!) 182/102     Pulse Rate 07/22/22 1215 87     Resp 07/22/22 1215 16     Temp 07/22/22 1215 98 F (36.7 C)     Temp Source 07/22/22 1215 Oral     SpO2 07/22/22 1215 93 %     Weight --      Height --      Head Circumference --      Peak Flow --      Pain Score 07/22/22 1215 0     Pain Loc --      Pain Edu? --      Excl. in [place]? --     Most recent vital signs: Vitals:   07/22/22 1215 07/22/22 1215  BP:  (!) 182/102  Pulse: 87 87  Resp: 16   Temp: 98 F (36.7 C) 98 F (36.7 C)  SpO2:  93%     General: Awake, no distress.  CV:  Good peripheral perfusion.  Resp:  Normal effort.  Abd:  No distention.  Other:  Patient is a little bit of redness noted on the left middle toe where a bug bite had occurred.   ED Results / Procedures / Treatments   Labs (all labs ordered are listed, but only abnormal results are displayed) Labs Reviewed  CBC WITH DIFFERENTIAL/PLATELET  COMPREHENSIVE METABOLIC PANEL  CBG MONITORING, ED     EKG  My interpretation of EKG:  Sinus rhythm 65 without any ST elevation or T wave inversions other than V2, normal intervals  RADIOLOGY I have reviewed the CT head personally and  interpreted and no evidence of intracranial mass   PROCEDURES:  Critical Care performed: No  .1-3 Lead EKG Interpretation  Performed by: Vanessa Piffard, MD Authorized by: Vanessa Worthington, MD     Interpretation: normal     ECG rate:  80   ECG rate assessment: normal     Rhythm: sinus rhythm     Ectopy: none     Conduction: normal      MEDICATIONS ORDERED IN ED: Medications - No data to display   IMPRESSION / MDM / [place] / ED COURSE  I reviewed the triage vital signs and the nursing notes.   Patient's presentation is most consistent with acute presentation with potential threat to life or bodily function.   Patient comes in with concern for allergic reaction with history of anaphylaxis.  No current evidence of anaphylaxis.  We will give some IV Benadryl, IV Pepcid, IV steroids.  When patient was getting the IV Benadryl push she had an episode where she became unresponsive with eye staring off.  Patient was laid flat and shortly after started feeling her normal self.  I suspect that was just a reaction to the IV Benadryl but given patient's history will get labs, CT head to make sure this was not a seizure but seems less likely.  Will get EKG to evaluate for any arrhythmia  CMP slightly elevated alk phos.  CBC normal.  CT head is negative for acute findings.  Provided copy of results  On repeat evaluation patient reports feeling much better.  She had a very minimal reaction so we will hold off on steroids but we discussed oral Benadryl given she is tolerated this well for her in the future and for later tonight to help prevent rebound.  She expressed understanding felt comfortable with discharge home.  We discussed return precautions if she develops any other new symptoms or concerns.  She does have a EpiPen at home does not you refill  The patient is on the cardiac monitor to evaluate for evidence of arrhythmia and/or significant heart rate changes.      FINAL  CLINICAL IMPRESSION(S) / ED DIAGNOSES   Final diagnoses:  Allergic reaction, initial encounter     Rx / DC Orders   ED Discharge Orders     None        Note:  This document was prepared using Dragon voice recognition software and may include unintentional dictation errors.   Vanessa Cohoes, MD 07/22/22 [phone]

## 2022-07-22 NOTE — ED Notes (Signed)
E signature pad not working. Pt educated on discharge instructions and verbalized understanding.  

## 2022-07-22 NOTE — ED Triage Notes (Signed)
Pt reports that about 20 minutes ago she got bit by a fire ant. Pt reports that she had anaphylaxis reaction to a fire ant about 10 years ago. Pt reports that she was bit on her left foot and it is now tingling up her leg.

## 2022-07-22 NOTE — Discharge Instructions (Addendum)
Take Benadryl as needed at home to help prevent rebound reaction and to help with itching.  You can take over-the-counter Zyrtec or Allegra as well.  IMPRESSION: 1. No acute intracranial findings. 2. Chronic microangiopathy.

## 2022-07-22 NOTE — ED Provider Triage Note (Signed)
Emergency Medicine Provider Triage Evaluation Note  Becky Mitchell , a 71 y.o. female  was evaluated in triage.  Pt complains of sect bite.  Had severe allergic reaction to fire ants.  Thinks it was a Archivist while at the [employer].  States no difficulty breathing yet.  Is concerned she will need to use her EpiPen.  Review of Systems  Positive: Allergic reaction Negative: Culture.  Physical Exam  BP (!) 182/102   Pulse 87   Temp 98 F (36.7 C) (Oral)   Resp 16   SpO2 93%  Gen:   Awake, no distress   Resp:  Normal effort  MSK:   Moves extremities without difficulty  Other:    Medical Decision Making  Medically screening exam initiated at 12:15 PM.  Appropriate orders placed.  Becky Mitchell was informed that the remainder of the evaluation will be completed by another provider, this initial triage assessment does not replace that evaluation, and the importance of remaining in the ED until their evaluation is complete.  Instructed nursing staff to take her to flex.   Faythe Ghee, PA-C 07/22/22 1218

## 2022-07-23 ENCOUNTER — Ambulatory Visit
Admission: RE | Admit: 2022-07-23 | Discharge: 2022-07-23 | Disposition: A | Discharge status: Home / Self Care | Payer: Managed Care, Other (non HMO) | Source: Ambulatory Visit | Attending: Radiation Oncology | Admitting: Radiation Oncology

## 2022-07-23 ENCOUNTER — Other Ambulatory Visit: Payer: Self-pay

## 2022-07-23 DIAGNOSIS — Z510 Encounter for antineoplastic radiation therapy: Secondary | ICD-10-CM | POA: Diagnosis not present

## 2022-07-23 LAB — RAD ONC ARIA SESSION SUMMARY
Course Elapsed Days: 2
Plan Fractions Treated to Date: 3
Plan Prescribed Dose Per Fraction: 2.66 Gy
Plan Total Fractions Prescribed: 16
Plan Total Prescribed Dose: 42.56 Gy
Reference Point Dosage Given to Date: 7.98 Gy
Reference Point Session Dosage Given: 2.66 Gy
Session Number: 3

## 2022-07-24 ENCOUNTER — Ambulatory Visit
Admission: RE | Admit: 2022-07-24 | Discharge: 2022-07-24 | Disposition: A | Discharge status: Home / Self Care | Payer: Managed Care, Other (non HMO) | Source: Ambulatory Visit | Attending: Radiation Oncology | Admitting: Radiation Oncology

## 2022-07-24 ENCOUNTER — Other Ambulatory Visit: Payer: Self-pay

## 2022-07-24 DIAGNOSIS — Z510 Encounter for antineoplastic radiation therapy: Secondary | ICD-10-CM | POA: Diagnosis not present

## 2022-07-24 LAB — RAD ONC ARIA SESSION SUMMARY
Course Elapsed Days: 3
Plan Fractions Treated to Date: 4
Plan Prescribed Dose Per Fraction: 2.66 Gy
Plan Total Fractions Prescribed: 16
Plan Total Prescribed Dose: 42.56 Gy
Reference Point Dosage Given to Date: 10.64 Gy
Reference Point Session Dosage Given: 2.66 Gy
Session Number: 4

## 2022-07-27 ENCOUNTER — Ambulatory Visit
Admission: RE | Admit: 2022-07-27 | Discharge: 2022-07-27 | Disposition: A | Discharge status: Home / Self Care | Payer: Managed Care, Other (non HMO) | Source: Ambulatory Visit | Attending: Radiation Oncology | Admitting: Radiation Oncology

## 2022-07-27 ENCOUNTER — Other Ambulatory Visit: Payer: Self-pay

## 2022-07-27 DIAGNOSIS — Z510 Encounter for antineoplastic radiation therapy: Secondary | ICD-10-CM | POA: Diagnosis not present

## 2022-07-27 LAB — RAD ONC ARIA SESSION SUMMARY
Course Elapsed Days: 6
Plan Fractions Treated to Date: 5
Plan Prescribed Dose Per Fraction: 2.66 Gy
Plan Total Fractions Prescribed: 16
Plan Total Prescribed Dose: 42.56 Gy
Reference Point Dosage Given to Date: 13.3 Gy
Reference Point Session Dosage Given: 2.66 Gy
Session Number: 5

## 2022-07-28 ENCOUNTER — Other Ambulatory Visit: Payer: Self-pay

## 2022-07-28 ENCOUNTER — Ambulatory Visit
Admission: RE | Admit: 2022-07-28 | Discharge: 2022-07-28 | Disposition: A | Discharge status: Home / Self Care | Payer: Managed Care, Other (non HMO) | Source: Ambulatory Visit | Attending: Radiation Oncology | Admitting: Radiation Oncology

## 2022-07-28 DIAGNOSIS — Z510 Encounter for antineoplastic radiation therapy: Secondary | ICD-10-CM | POA: Diagnosis not present

## 2022-07-28 LAB — RAD ONC ARIA SESSION SUMMARY
Course Elapsed Days: 7
Plan Fractions Treated to Date: 6
Plan Prescribed Dose Per Fraction: 2.66 Gy
Plan Total Fractions Prescribed: 16
Plan Total Prescribed Dose: 42.56 Gy
Reference Point Dosage Given to Date: 15.96 Gy
Reference Point Session Dosage Given: 2.66 Gy
Session Number: 6

## 2022-07-29 ENCOUNTER — Ambulatory Visit
Admission: RE | Admit: 2022-07-29 | Discharge: 2022-07-29 | Disposition: A | Discharge status: Home / Self Care | Payer: Managed Care, Other (non HMO) | Source: Ambulatory Visit | Attending: Radiation Oncology | Admitting: Radiation Oncology

## 2022-07-29 ENCOUNTER — Other Ambulatory Visit: Payer: Self-pay

## 2022-07-29 DIAGNOSIS — Z510 Encounter for antineoplastic radiation therapy: Secondary | ICD-10-CM | POA: Diagnosis not present

## 2022-07-29 LAB — RAD ONC ARIA SESSION SUMMARY
Course Elapsed Days: 8
Plan Fractions Treated to Date: 7
Plan Prescribed Dose Per Fraction: 2.66 Gy
Plan Total Fractions Prescribed: 16
Plan Total Prescribed Dose: 42.56 Gy
Reference Point Dosage Given to Date: 18.62 Gy
Reference Point Session Dosage Given: 2.66 Gy
Session Number: 7

## 2022-07-29 IMAGING — MG DIGITAL DIAGNOSTIC BILAT W/ TOMO W/ CAD
8 series · 8 of 20 positions shown · non-contrast
Comparison: Previous exam(s).
COMPARISON: Previous exam(s).

Addendum:
CLINICAL DATA: Possible asymmetry in the outer anterior right
breast in the craniocaudal projection of the recent screening
mammogram. Calcifications in the left breast on the screening
mammogram.

EXAM:
DIGITAL DIAGNOSTIC BILATERAL MAMMOGRAM WITH TOMOSYNTHESIS AND CAD
TECHNIQUE: Bilateral digital diagnostic mammography and breast tomosynthesis
was performed. The images were evaluated with computer-aided
detection.

[L CC]
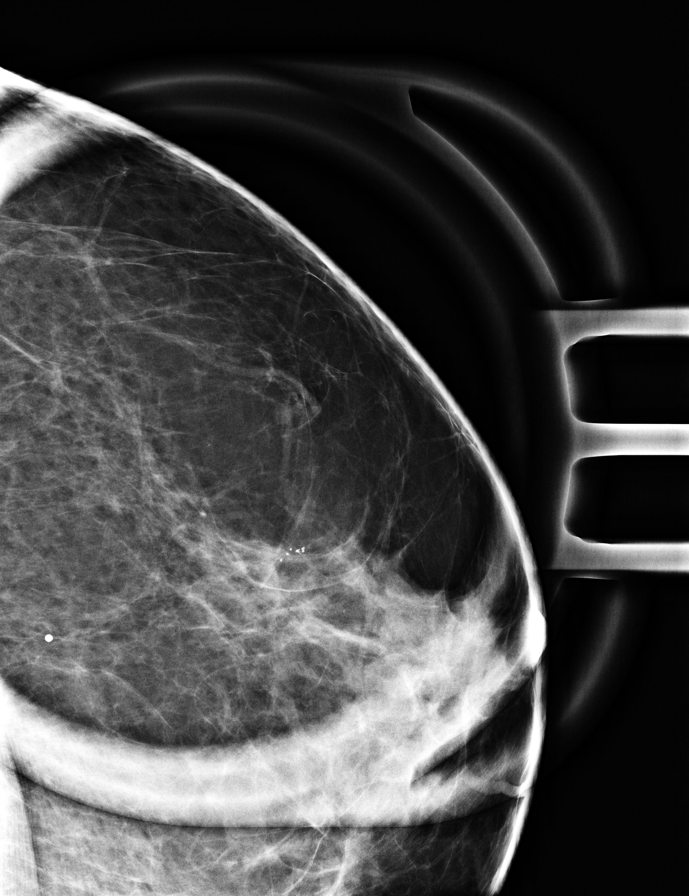

[L ML]
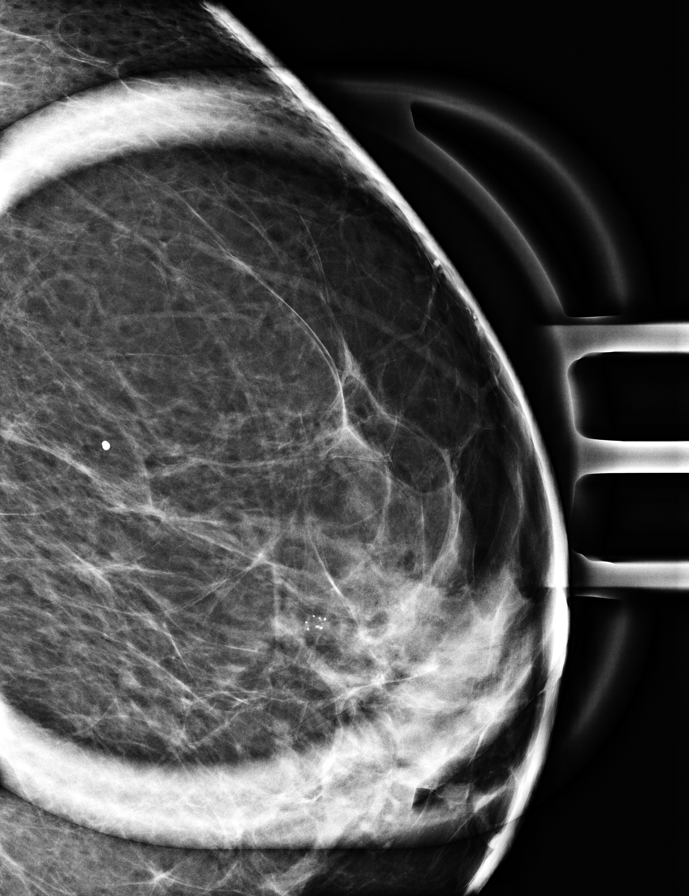

[L ML synth-2D]
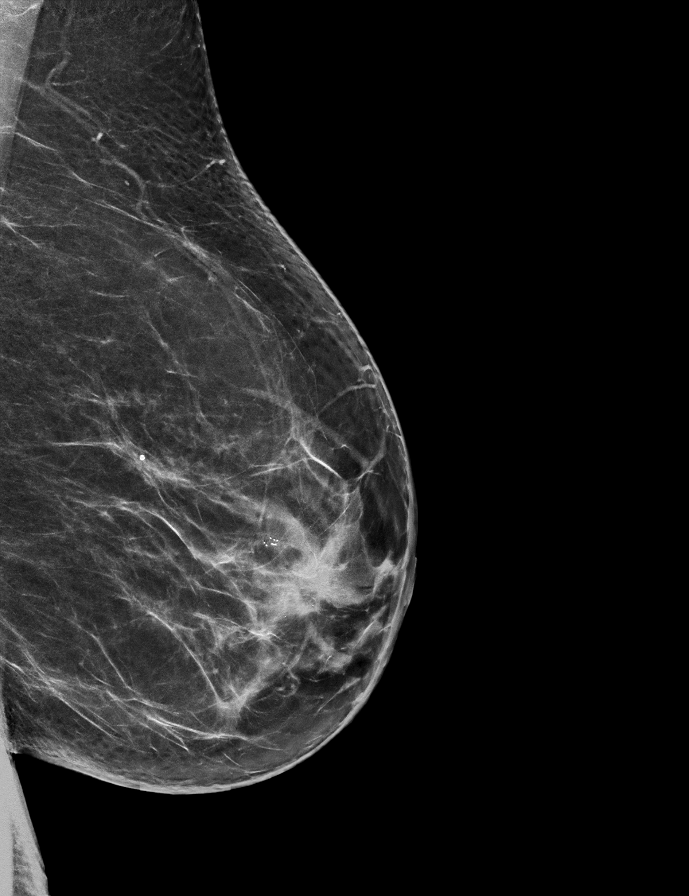

[R CC synth-2D]
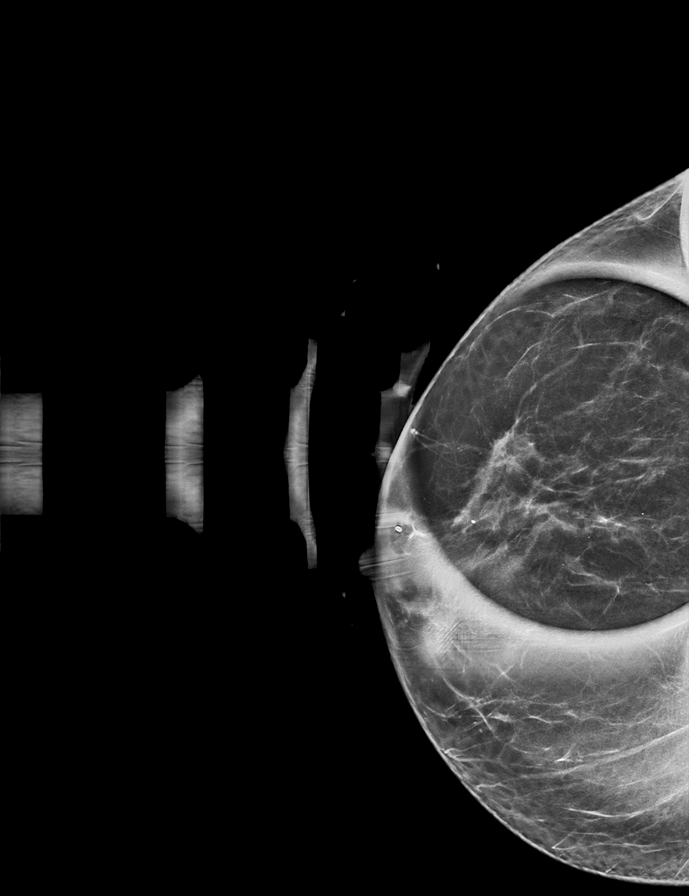

[R ML synth-2D]
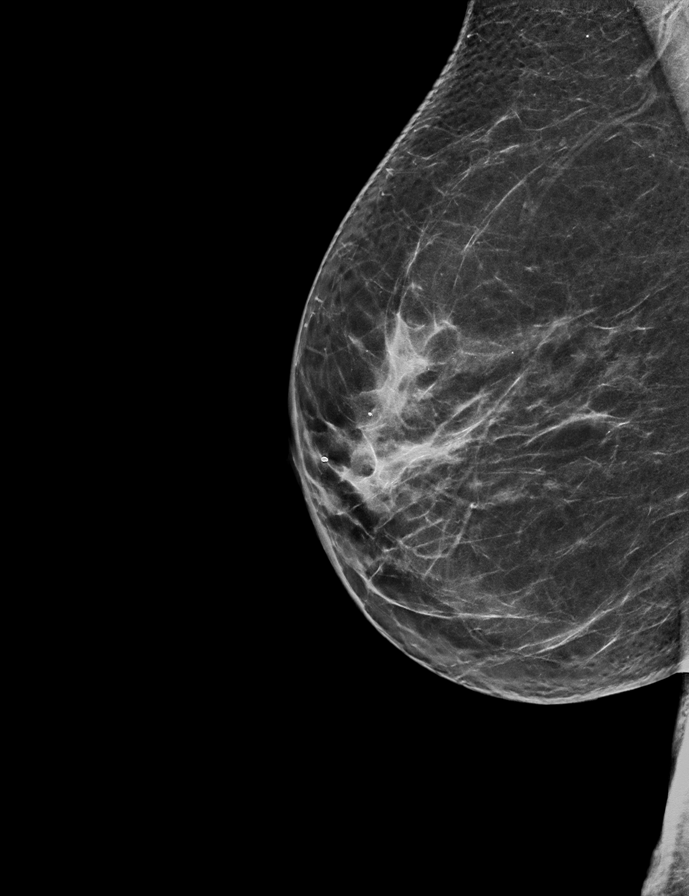

[R ML tomo · tomo slice 39/78.0]
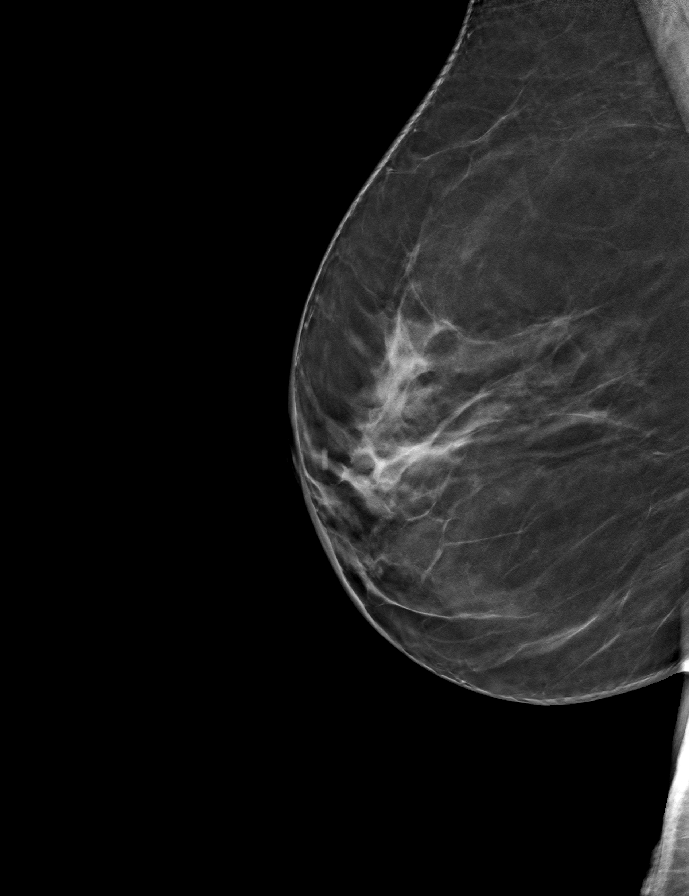

[L ML tomo · tomo slice 40/79.0]
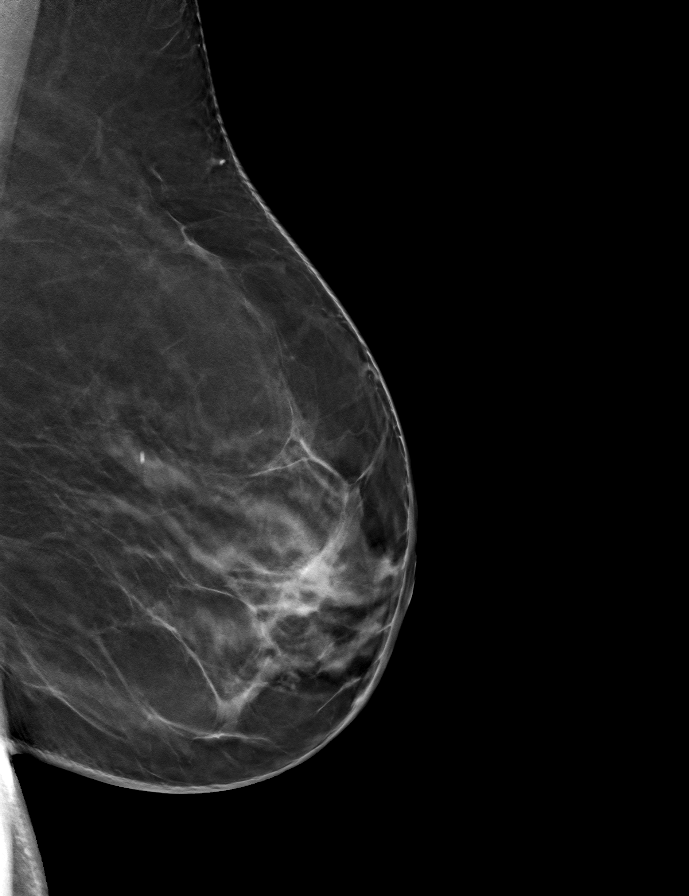

[R CC tomo · tomo slice 33/64.0]
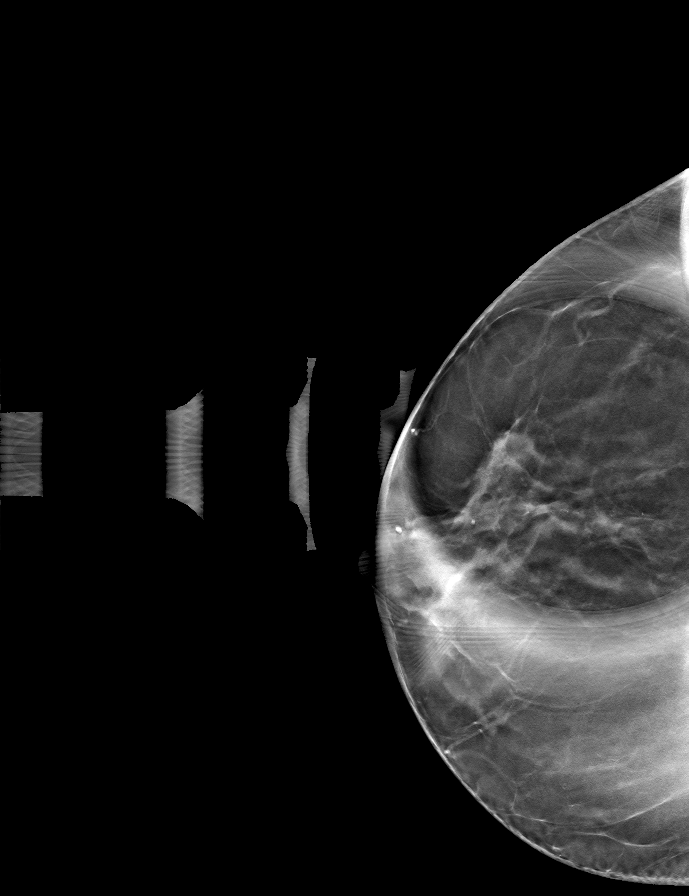

[8 of 20 positions shown; findings below may reference images not displayed]

ACR Breast Density Category b: There are scattered areas of
fibroglandular density.
FINDINGS: 3D tomographic and 2D generated true lateral and spot compression
craniocaudal images of the right breast demonstrate normal appearing
fibroglandular tissue at the location of the recently suspected
asymmetry in the outer, anterior right breast, unchanged compared to
previous examinations.

3D tomographic and 2D generated true lateral and 2D spot
magnification images of the left breast demonstrate a 4 mm group of
calcifications in the 3 o'clock position of the breast anteriorly.
The majority of these are oval, dense and circumscribed. One has Klever
Awa Tiger appearance.
IMPRESSION: 1. The recently suspected right breast asymmetry was close
apposition of normal breast tissue.
2. 4 mm group of benign calcifications in the anterior 3 o'clock
position of the left breast, compatible with a small, degenerated
fibroadenoma.

RECOMMENDATION:
Bilateral screening mammogram in 1 year when due.

I have discussed the findings and recommendations with the patient.
If applicable, a reminder letter will be sent to the patient
regarding the next appointment.

BI-RADS CATEGORY  2: Benign.

ADDENDUM:
On further review of the images, the 4 mm group of calcifications in
the anterior 3 o'clock position of the left breast have an
indeterminate appearance with a small area of DCIS a possibility.
Therefore, 3D stereotactic guided core needle biopsy is recommended.
This has been discussed with the patient and she will be assisted in
getting the biopsy scheduled.

BI-RADS Category:  4: Suspicious.

*** End of Addendum ***
ACR Breast Density Category b: There are scattered areas of
fibroglandular density.
FINDINGS: 3D tomographic and 2D generated true lateral and spot compression
craniocaudal images of the right breast demonstrate normal appearing
fibroglandular tissue at the location of the recently suspected
asymmetry in the outer, anterior right breast, unchanged compared to
previous examinations.

3D tomographic and 2D generated true lateral and 2D spot
magnification images of the left breast demonstrate a 4 mm group of
calcifications in the 3 o'clock position of the breast anteriorly.
The majority of these are oval, dense and circumscribed. One has Klever
Awa Tiger appearance.
IMPRESSION: 1. The recently suspected right breast asymmetry was close
apposition of normal breast tissue.
2. 4 mm group of benign calcifications in the anterior 3 o'clock
position of the left breast, compatible with a small, degenerated
fibroadenoma.

RECOMMENDATION:
Bilateral screening mammogram in 1 year when due.

I have discussed the findings and recommendations with the patient.
If applicable, a reminder letter will be sent to the patient
regarding the next appointment.

BI-RADS CATEGORY  2: Benign.

## 2022-07-30 ENCOUNTER — Other Ambulatory Visit: Payer: Self-pay | Admitting: *Deleted

## 2022-07-30 ENCOUNTER — Ambulatory Visit
Admission: RE | Admit: 2022-07-30 | Discharge: 2022-07-30 | Disposition: A | Discharge status: Home / Self Care | Payer: Managed Care, Other (non HMO) | Source: Ambulatory Visit | Attending: Radiation Oncology | Admitting: Radiation Oncology

## 2022-07-30 ENCOUNTER — Inpatient Hospital Stay: Payer: Managed Care, Other (non HMO) | Attending: Oncology

## 2022-07-30 ENCOUNTER — Other Ambulatory Visit: Payer: Self-pay

## 2022-07-30 DIAGNOSIS — D0512 Intraductal carcinoma in situ of left breast: Secondary | ICD-10-CM

## 2022-07-30 DIAGNOSIS — Z510 Encounter for antineoplastic radiation therapy: Secondary | ICD-10-CM | POA: Diagnosis not present

## 2022-07-30 LAB — CBC
HCT: 41.5 % (ref 36.0–46.0)
Hemoglobin: 14.5 g/dL (ref 12.0–15.0)
MCH: 31.9 pg (ref 26.0–34.0)
MCHC: 34.9 g/dL (ref 30.0–36.0)
MCV: 91.4 fL (ref 80.0–100.0)
Platelets: 150 10*3/uL (ref 150–400)
RBC: 4.54 MIL/uL (ref 3.87–5.11)
RDW: 12.3 % (ref 11.5–15.5)
WBC: 5.1 10*3/uL (ref 4.0–10.5)
nRBC: 0 % (ref 0.0–0.2)

## 2022-07-30 LAB — RAD ONC ARIA SESSION SUMMARY
Course Elapsed Days: 9
Plan Fractions Treated to Date: 8
Plan Prescribed Dose Per Fraction: 2.66 Gy
Plan Total Fractions Prescribed: 16
Plan Total Prescribed Dose: 42.56 Gy
Reference Point Dosage Given to Date: 21.28 Gy
Reference Point Session Dosage Given: 2.66 Gy
Session Number: 8

## 2022-07-30 MED ORDER — STRATA XRT EX GEL: 1.0000 | Freq: Three times a day (TID) | CUTANEOUS | 0 refills | Status: DC

## 2022-07-30 MED ORDER — STRATA XRT EX GEL: 1.0000 | Freq: Three times a day (TID) | CUTANEOUS | 0 refills | Status: DC | PRN

## 2022-07-30 NOTE — Progress Notes (Signed)
Patient in clinic for treatment she reports that her left breast has become red and painful. Ordered  Strata XrT per Dr. Maryjean Ka education given on the appropriate application process.

## 2022-07-31 ENCOUNTER — Other Ambulatory Visit: Payer: Self-pay

## 2022-07-31 ENCOUNTER — Ambulatory Visit
Admission: RE | Admit: 2022-07-31 | Discharge: 2022-07-31 | Disposition: A | Discharge status: Home / Self Care | Payer: Managed Care, Other (non HMO) | Source: Ambulatory Visit | Attending: Radiation Oncology | Admitting: Radiation Oncology

## 2022-07-31 DIAGNOSIS — Z510 Encounter for antineoplastic radiation therapy: Secondary | ICD-10-CM | POA: Diagnosis not present

## 2022-07-31 LAB — RAD ONC ARIA SESSION SUMMARY
Course Elapsed Days: 10
Plan Fractions Treated to Date: 9
Plan Prescribed Dose Per Fraction: 2.66 Gy
Plan Total Fractions Prescribed: 16
Plan Total Prescribed Dose: 42.56 Gy
Reference Point Dosage Given to Date: 23.94 Gy
Reference Point Session Dosage Given: 2.66 Gy
Session Number: 9

## 2022-08-03 ENCOUNTER — Other Ambulatory Visit: Payer: Self-pay

## 2022-08-03 ENCOUNTER — Ambulatory Visit
Admission: RE | Admit: 2022-08-03 | Discharge: 2022-08-03 | Disposition: A | Discharge status: Home / Self Care | Payer: Managed Care, Other (non HMO) | Source: Ambulatory Visit | Attending: Radiation Oncology | Admitting: Radiation Oncology

## 2022-08-03 DIAGNOSIS — Z510 Encounter for antineoplastic radiation therapy: Secondary | ICD-10-CM | POA: Diagnosis not present

## 2022-08-03 LAB — RAD ONC ARIA SESSION SUMMARY
Course Elapsed Days: 13
Plan Fractions Treated to Date: 10
Plan Prescribed Dose Per Fraction: 2.66 Gy
Plan Total Fractions Prescribed: 16
Plan Total Prescribed Dose: 42.56 Gy
Reference Point Dosage Given to Date: 26.6 Gy
Reference Point Session Dosage Given: 2.66 Gy
Session Number: 10

## 2022-08-04 ENCOUNTER — Ambulatory Visit
Admission: RE | Admit: 2022-08-04 | Discharge: 2022-08-04 | Disposition: A | Discharge status: Home / Self Care | Payer: Managed Care, Other (non HMO) | Source: Ambulatory Visit | Attending: Radiation Oncology | Admitting: Radiation Oncology

## 2022-08-04 ENCOUNTER — Other Ambulatory Visit: Payer: Self-pay

## 2022-08-04 DIAGNOSIS — Z510 Encounter for antineoplastic radiation therapy: Secondary | ICD-10-CM | POA: Diagnosis not present

## 2022-08-04 LAB — RAD ONC ARIA SESSION SUMMARY
Course Elapsed Days: 14
Plan Fractions Treated to Date: 11
Plan Prescribed Dose Per Fraction: 2.66 Gy
Plan Total Fractions Prescribed: 16
Plan Total Prescribed Dose: 42.56 Gy
Reference Point Dosage Given to Date: 29.26 Gy
Reference Point Session Dosage Given: 2.66 Gy
Session Number: 11

## 2022-08-05 ENCOUNTER — Other Ambulatory Visit: Payer: Self-pay

## 2022-08-05 ENCOUNTER — Ambulatory Visit
Admission: RE | Admit: 2022-08-05 | Discharge: 2022-08-05 | Disposition: A | Discharge status: Home / Self Care | Payer: Managed Care, Other (non HMO) | Source: Ambulatory Visit | Attending: Radiation Oncology | Admitting: Radiation Oncology

## 2022-08-05 DIAGNOSIS — Z510 Encounter for antineoplastic radiation therapy: Secondary | ICD-10-CM | POA: Diagnosis not present

## 2022-08-05 LAB — RAD ONC ARIA SESSION SUMMARY
Course Elapsed Days: 15
Plan Fractions Treated to Date: 12
Plan Prescribed Dose Per Fraction: 2.66 Gy
Plan Total Fractions Prescribed: 16
Plan Total Prescribed Dose: 42.56 Gy
Reference Point Dosage Given to Date: 31.92 Gy
Reference Point Session Dosage Given: 2.66 Gy
Session Number: 12

## 2022-08-06 ENCOUNTER — Other Ambulatory Visit: Payer: Self-pay

## 2022-08-06 ENCOUNTER — Ambulatory Visit
Admission: RE | Admit: 2022-08-06 | Discharge: 2022-08-06 | Disposition: A | Discharge status: Home / Self Care | Payer: Managed Care, Other (non HMO) | Source: Ambulatory Visit | Attending: Radiation Oncology | Admitting: Radiation Oncology

## 2022-08-06 DIAGNOSIS — Z510 Encounter for antineoplastic radiation therapy: Secondary | ICD-10-CM | POA: Diagnosis not present

## 2022-08-06 LAB — RAD ONC ARIA SESSION SUMMARY
Course Elapsed Days: 16
Plan Fractions Treated to Date: 13
Plan Prescribed Dose Per Fraction: 2.66 Gy
Plan Total Fractions Prescribed: 16
Plan Total Prescribed Dose: 42.56 Gy
Reference Point Dosage Given to Date: 34.58 Gy
Reference Point Session Dosage Given: 2.66 Gy
Session Number: 13

## 2022-08-07 ENCOUNTER — Ambulatory Visit
Admission: RE | Admit: 2022-08-07 | Discharge: 2022-08-07 | Disposition: A | Discharge status: Home / Self Care | Payer: Managed Care, Other (non HMO) | Source: Ambulatory Visit | Attending: Radiation Oncology | Admitting: Radiation Oncology

## 2022-08-07 ENCOUNTER — Other Ambulatory Visit: Payer: Self-pay

## 2022-08-07 DIAGNOSIS — Z510 Encounter for antineoplastic radiation therapy: Secondary | ICD-10-CM | POA: Diagnosis not present

## 2022-08-07 LAB — RAD ONC ARIA SESSION SUMMARY
Course Elapsed Days: 17
Plan Fractions Treated to Date: 14
Plan Prescribed Dose Per Fraction: 2.66 Gy
Plan Total Fractions Prescribed: 16
Plan Total Prescribed Dose: 42.56 Gy
Reference Point Dosage Given to Date: 37.24 Gy
Reference Point Session Dosage Given: 2.66 Gy
Session Number: 14

## 2022-08-10 ENCOUNTER — Ambulatory Visit
Admission: RE | Admit: 2022-08-10 | Discharge: 2022-08-10 | Disposition: A | Discharge status: Home / Self Care | Payer: Managed Care, Other (non HMO) | Source: Ambulatory Visit | Attending: Radiation Oncology | Admitting: Radiation Oncology

## 2022-08-10 ENCOUNTER — Other Ambulatory Visit: Payer: Self-pay

## 2022-08-10 DIAGNOSIS — Z510 Encounter for antineoplastic radiation therapy: Secondary | ICD-10-CM | POA: Diagnosis not present

## 2022-08-10 LAB — RAD ONC ARIA SESSION SUMMARY
Course Elapsed Days: 20
Plan Fractions Treated to Date: 15
Plan Prescribed Dose Per Fraction: 2.66 Gy
Plan Total Fractions Prescribed: 16
Plan Total Prescribed Dose: 42.56 Gy
Reference Point Dosage Given to Date: 39.9 Gy
Reference Point Session Dosage Given: 2.66 Gy
Session Number: 15

## 2022-08-11 ENCOUNTER — Other Ambulatory Visit: Payer: Self-pay

## 2022-08-11 ENCOUNTER — Ambulatory Visit
Admission: RE | Admit: 2022-08-11 | Discharge: 2022-08-11 | Disposition: A | Discharge status: Home / Self Care | Payer: Managed Care, Other (non HMO) | Source: Ambulatory Visit | Attending: Radiation Oncology | Admitting: Radiation Oncology

## 2022-08-11 DIAGNOSIS — Z510 Encounter for antineoplastic radiation therapy: Secondary | ICD-10-CM | POA: Diagnosis not present

## 2022-08-11 LAB — RAD ONC ARIA SESSION SUMMARY
Course Elapsed Days: 21
Plan Fractions Treated to Date: 16
Plan Prescribed Dose Per Fraction: 2.66 Gy
Plan Total Fractions Prescribed: 16
Plan Total Prescribed Dose: 42.56 Gy
Reference Point Dosage Given to Date: 42.56 Gy
Reference Point Session Dosage Given: 2.66 Gy
Session Number: 16

## 2022-08-12 ENCOUNTER — Ambulatory Visit
Admission: RE | Admit: 2022-08-12 | Discharge: 2022-08-12 | Disposition: A | Discharge status: Home / Self Care | Payer: Managed Care, Other (non HMO) | Source: Ambulatory Visit | Attending: Radiation Oncology | Admitting: Radiation Oncology

## 2022-08-12 ENCOUNTER — Other Ambulatory Visit: Payer: Self-pay

## 2022-08-12 DIAGNOSIS — Z510 Encounter for antineoplastic radiation therapy: Secondary | ICD-10-CM | POA: Diagnosis not present

## 2022-08-12 LAB — RAD ONC ARIA SESSION SUMMARY
Course Elapsed Days: 22
Plan Fractions Treated to Date: 1
Plan Prescribed Dose Per Fraction: 2 Gy
Plan Total Fractions Prescribed: 8
Plan Total Prescribed Dose: 16 Gy
Reference Point Dosage Given to Date: 2 Gy
Reference Point Session Dosage Given: 2 Gy
Session Number: 17

## 2022-08-13 ENCOUNTER — Inpatient Hospital Stay: Payer: Managed Care, Other (non HMO)

## 2022-08-13 ENCOUNTER — Other Ambulatory Visit: Payer: Self-pay

## 2022-08-13 ENCOUNTER — Other Ambulatory Visit: Payer: Self-pay | Admitting: *Deleted

## 2022-08-13 ENCOUNTER — Ambulatory Visit
Admission: RE | Admit: 2022-08-13 | Discharge: 2022-08-13 | Disposition: A | Discharge status: Home / Self Care | Payer: Managed Care, Other (non HMO) | Source: Ambulatory Visit | Attending: Radiation Oncology | Admitting: Radiation Oncology

## 2022-08-13 DIAGNOSIS — Z510 Encounter for antineoplastic radiation therapy: Secondary | ICD-10-CM | POA: Diagnosis not present

## 2022-08-13 DIAGNOSIS — D0512 Intraductal carcinoma in situ of left breast: Secondary | ICD-10-CM

## 2022-08-13 LAB — RAD ONC ARIA SESSION SUMMARY
Course Elapsed Days: 23
Plan Fractions Treated to Date: 2
Plan Prescribed Dose Per Fraction: 2 Gy
Plan Total Fractions Prescribed: 8
Plan Total Prescribed Dose: 16 Gy
Reference Point Dosage Given to Date: 4 Gy
Reference Point Session Dosage Given: 2 Gy
Session Number: 18

## 2022-08-13 LAB — CBC
HCT: 39.1 % (ref 36.0–46.0)
Hemoglobin: 13.9 g/dL (ref 12.0–15.0)
MCH: 31.9 pg (ref 26.0–34.0)
MCHC: 35.5 g/dL (ref 30.0–36.0)
MCV: 89.7 fL (ref 80.0–100.0)
Platelets: 104 10*3/uL — ABNORMAL LOW (ref 150–400)
RBC: 4.36 MIL/uL (ref 3.87–5.11)
RDW: 12.2 % (ref 11.5–15.5)
WBC: 4.3 10*3/uL (ref 4.0–10.5)
nRBC: 0 % (ref 0.0–0.2)

## 2022-08-13 MED ORDER — CEPHALEXIN 500 MG PO CAPS: 500.0000 mg | ORAL_CAPSULE | Freq: Three times a day (TID) | ORAL | 0 refills | Status: DC

## 2022-08-14 ENCOUNTER — Other Ambulatory Visit: Payer: Self-pay

## 2022-08-14 ENCOUNTER — Ambulatory Visit
Admission: RE | Admit: 2022-08-14 | Discharge: 2022-08-14 | Disposition: A | Discharge status: Home / Self Care | Payer: Managed Care, Other (non HMO) | Source: Ambulatory Visit | Attending: Radiation Oncology | Admitting: Radiation Oncology

## 2022-08-14 DIAGNOSIS — Z510 Encounter for antineoplastic radiation therapy: Secondary | ICD-10-CM | POA: Diagnosis not present

## 2022-08-14 LAB — RAD ONC ARIA SESSION SUMMARY
Course Elapsed Days: 24
Plan Fractions Treated to Date: 3
Plan Prescribed Dose Per Fraction: 2 Gy
Plan Total Fractions Prescribed: 8
Plan Total Prescribed Dose: 16 Gy
Reference Point Dosage Given to Date: 6 Gy
Reference Point Session Dosage Given: 2 Gy
Session Number: 19

## 2022-08-17 ENCOUNTER — Other Ambulatory Visit: Payer: Self-pay

## 2022-08-17 ENCOUNTER — Ambulatory Visit
Admission: RE | Admit: 2022-08-17 | Discharge: 2022-08-17 | Disposition: A | Discharge status: Home / Self Care | Payer: Managed Care, Other (non HMO) | Source: Ambulatory Visit | Attending: Radiation Oncology | Admitting: Radiation Oncology

## 2022-08-17 DIAGNOSIS — D0512 Intraductal carcinoma in situ of left breast: Secondary | ICD-10-CM | POA: Diagnosis present

## 2022-08-17 LAB — RAD ONC ARIA SESSION SUMMARY
Course Elapsed Days: 27
Plan Fractions Treated to Date: 4
Plan Prescribed Dose Per Fraction: 2 Gy
Plan Total Fractions Prescribed: 8
Plan Total Prescribed Dose: 16 Gy
Reference Point Dosage Given to Date: 8 Gy
Reference Point Session Dosage Given: 2 Gy
Session Number: 20

## 2022-08-18 ENCOUNTER — Other Ambulatory Visit: Payer: Self-pay

## 2022-08-18 ENCOUNTER — Ambulatory Visit
Admission: RE | Admit: 2022-08-18 | Discharge: 2022-08-18 | Disposition: A | Discharge status: Home / Self Care | Payer: Managed Care, Other (non HMO) | Source: Ambulatory Visit | Attending: Radiation Oncology | Admitting: Radiation Oncology

## 2022-08-18 ENCOUNTER — Other Ambulatory Visit: Payer: Self-pay | Admitting: *Deleted

## 2022-08-18 DIAGNOSIS — D0512 Intraductal carcinoma in situ of left breast: Secondary | ICD-10-CM | POA: Diagnosis not present

## 2022-08-18 LAB — RAD ONC ARIA SESSION SUMMARY
Course Elapsed Days: 28
Plan Fractions Treated to Date: 5
Plan Prescribed Dose Per Fraction: 2 Gy
Plan Total Fractions Prescribed: 8
Plan Total Prescribed Dose: 16 Gy
Reference Point Dosage Given to Date: 10 Gy
Reference Point Session Dosage Given: 2 Gy
Session Number: 21

## 2022-08-19 ENCOUNTER — Other Ambulatory Visit: Payer: Self-pay

## 2022-08-19 ENCOUNTER — Ambulatory Visit
Admission: RE | Admit: 2022-08-19 | Discharge: 2022-08-19 | Disposition: A | Discharge status: Home / Self Care | Payer: Managed Care, Other (non HMO) | Source: Ambulatory Visit | Attending: Radiation Oncology | Admitting: Radiation Oncology

## 2022-08-19 DIAGNOSIS — D0512 Intraductal carcinoma in situ of left breast: Secondary | ICD-10-CM | POA: Diagnosis not present

## 2022-08-19 LAB — RAD ONC ARIA SESSION SUMMARY
Course Elapsed Days: 29
Plan Fractions Treated to Date: 6
Plan Prescribed Dose Per Fraction: 2 Gy
Plan Total Fractions Prescribed: 8
Plan Total Prescribed Dose: 16 Gy
Reference Point Dosage Given to Date: 12 Gy
Reference Point Session Dosage Given: 2 Gy
Session Number: 22

## 2022-08-20 ENCOUNTER — Ambulatory Visit
Admission: RE | Admit: 2022-08-20 | Discharge: 2022-08-20 | Disposition: A | Discharge status: Home / Self Care | Payer: Managed Care, Other (non HMO) | Source: Ambulatory Visit | Attending: Radiation Oncology | Admitting: Radiation Oncology

## 2022-08-20 ENCOUNTER — Other Ambulatory Visit: Payer: Self-pay

## 2022-08-20 DIAGNOSIS — D0512 Intraductal carcinoma in situ of left breast: Secondary | ICD-10-CM | POA: Diagnosis not present

## 2022-08-20 LAB — RAD ONC ARIA SESSION SUMMARY
Course Elapsed Days: 30
Plan Fractions Treated to Date: 7
Plan Prescribed Dose Per Fraction: 2 Gy
Plan Total Fractions Prescribed: 8
Plan Total Prescribed Dose: 16 Gy
Reference Point Dosage Given to Date: 14 Gy
Reference Point Session Dosage Given: 2 Gy
Session Number: 23

## 2022-08-21 ENCOUNTER — Other Ambulatory Visit: Payer: Self-pay

## 2022-08-21 ENCOUNTER — Ambulatory Visit
Admission: RE | Admit: 2022-08-21 | Discharge: 2022-08-21 | Disposition: A | Discharge status: Home / Self Care | Payer: Managed Care, Other (non HMO) | Source: Ambulatory Visit | Attending: Radiation Oncology | Admitting: Radiation Oncology

## 2022-08-21 DIAGNOSIS — D0512 Intraductal carcinoma in situ of left breast: Secondary | ICD-10-CM | POA: Diagnosis not present

## 2022-08-21 LAB — RAD ONC ARIA SESSION SUMMARY
Course Elapsed Days: 31
Plan Fractions Treated to Date: 8
Plan Prescribed Dose Per Fraction: 2 Gy
Plan Total Fractions Prescribed: 8
Plan Total Prescribed Dose: 16 Gy
Reference Point Dosage Given to Date: 16 Gy
Reference Point Session Dosage Given: 2 Gy
Session Number: 24

## 2022-08-24 ENCOUNTER — Encounter: Payer: Self-pay | Admitting: Oncology

## 2022-08-24 ENCOUNTER — Inpatient Hospital Stay: Payer: Managed Care, Other (non HMO)

## 2022-08-24 ENCOUNTER — Inpatient Hospital Stay: Payer: Managed Care, Other (non HMO) | Attending: Oncology | Admitting: Oncology

## 2022-08-24 VITALS — BP 159/84 | HR 64 | Temp 97.9°F | Resp 17 | Ht 63.0 in | Wt 160.0 lb

## 2022-08-24 DIAGNOSIS — M85852 Other specified disorders of bone density and structure, left thigh: Secondary | ICD-10-CM

## 2022-08-24 DIAGNOSIS — D696 Thrombocytopenia, unspecified: Secondary | ICD-10-CM | POA: Diagnosis not present

## 2022-08-24 DIAGNOSIS — D0512 Intraductal carcinoma in situ of left breast: Secondary | ICD-10-CM | POA: Insufficient documentation

## 2022-08-24 DIAGNOSIS — Z7189 Other specified counseling: Secondary | ICD-10-CM

## 2022-08-24 DIAGNOSIS — Z170 Estrogen receptor positive status [ER+]: Secondary | ICD-10-CM | POA: Insufficient documentation

## 2022-08-24 MED ORDER — ANASTROZOLE 1 MG PO TABS: 1.0000 mg | ORAL_TABLET | Freq: Every day | ORAL | 3 refills | Status: DC

## 2022-08-24 NOTE — Progress Notes (Signed)
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 Baruch Gouty  Date of visit: 08/24/22   History of presenting illness- Patient is a 71 year old female who had a routine screening mammogram in May 2023 which showed possible asymmetry in the right breast and possible microcalcifications in the left breast.  This was followed by a diagnostic mammogram which showed a 4 mm area of calcifications in the left breast.  Right breast asymmetry was thought to be apposition of normal breast tissue.  Patient had a biopsy of the calcifications which came back positive for at least atypical ductal hyperplasia.  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  ER 11 to 50% positive.  0.7 mm was the closest posterior margin.  Patient was seen by radiation oncology and underwent adjuvant radiation therapy but did not go to reexcision.  She has been referred for endocrine therapy.  ECOG PS- 0  Pain scale- 0   Review of systems- Review of Systems  Constitutional:  Negative for chills, fever, malaise/fatigue and weight loss.  HENT:  Negative for congestion, ear discharge and nosebleeds.   Eyes:  Negative for blurred vision.  Respiratory:  Negative for cough, hemoptysis, sputum production, shortness of breath and wheezing.   Cardiovascular:  Negative for chest pain, palpitations, orthopnea and claudication.  Gastrointestinal:  Negative for abdominal pain, blood in stool, constipation, diarrhea, heartburn, melena, nausea and vomiting.  Genitourinary:  Negative for dysuria, flank pain, frequency, hematuria and urgency.  Musculoskeletal:  Negative for back pain, joint pain and myalgias.   Skin:  Negative for rash.  Neurological:  Negative for dizziness, tingling, focal weakness, seizures, weakness and headaches.  Endo/Heme/Allergies:  Does not bruise/bleed easily.  Psychiatric/Behavioral:  Negative for depression and suicidal ideas. The patient does not have insomnia.     Allergies  Allergen Reactions   Fire Ant Anaphylaxis   Sulfa Antibiotics Shortness Of Breath and Rash   Bromfenac Other (See Comments)    MADE EYELIDS RED & EYES BLOODSHOT   Loteprednol Etabonate Other (See Comments)    MADE EYELIDS RED & EYES BLOODSHOT    Moxifloxacin Other (See Comments)    EYE DROPS---MADE EYELIDS RED & EYES BLOODSHOT    Latex Dermatitis   Tape Itching    Patient Active Problem List   Diagnosis Date Noted   Genetic testing 07/21/2022   Essential hypertension 05/27/2021   Hyperlipidemia, mixed 05/27/2021   Atherosclerotic heart disease of native coronary artery without angina pectoris 04/20/2017     Past Medical History:  Diagnosis Date   Allergies    Anginal pain ([place])    Arthritis    Asthma    Breast calcification, left    a.) Bx 05/20/2022 --> at least ADH, however features concerning for DCIS   Cataracts, bilateral    a.) s/p extraction [id number]   Complication of anesthesia    a.) delayed emergence following routine colonoscopy   Coronary artery disease 04/20/2017   a.) LHC/PCI [place] 04/20/2017 --> 85% pRCA, 85% pOM --> 2.5 x 12 mm Promus Primere DES to pRCA and 3.0 x 20 mm Promus Primere DES to pLCx; b.) LHC 11/06/2021: EF 55-65%, 25% p-mLM, 40% o-pLAD, 30-50% p-mLAD --> med  mgmt.   Dyspareunia in female    Family history of adverse reaction to anesthesia    a.) delayed emergence in first degree relative (mother)   Fatty liver    Heart murmur    Hyperlipidemia    Hypertension      Past Surgical History:  Procedure Laterality Date   ABDOMINAL HYSTERECTOMY  1975   BREAST BIOPSY Left 04/27/2022   stereo bx/ x clip/ extremely scant and  scattered detached groups of ductal epithelial cells in a background of adipose tissue   BREAST BIOPSY Left 05/20/2022   stereo bx, calcs, "COIL" clip-atypical ductal hyperplasia with associated calcifications.  Background mammary parenchyma with prior biopsy site change, fat necrosis, and focal lobular neoplasia.  Negative for invasive carcinoma.  Concerning for DCIS.   BREAST BIOPSY Left 06/12/2022   Procedure: BREAST BIOPSY WITH NEEDLE LOCALIZATION;  Surgeon: Robert Bellow, MD;  Location: [facility];  Service: General;  Laterality: Left;   BREAST LUMPECTOMY Left 2001   CATARACT EXTRACTION Bilateral 2020   CORONARY ANGIOPLASTY WITH STENT PLACEMENT Left 04/20/2017   Procedure: CORONARY ANGIOPLASTY WITH STENT PLACEMENT; Location: [place]; Surgeon: Ola Spurr, MD   FOOT SURGERY Left    LEFT HEART CATH AND CORONARY ANGIOGRAPHY N/A 11/06/2021   Procedure: LEFT HEART CATH AND CORONARY ANGIOGRAPHY;  Surgeon: Yolonda Kida, MD;  Location: [facility];  Service: Cardiovascular;  Laterality: N/A;   spine cyst aspiration  10/28/2017    Social History   Socioeconomic History   Marital status: Married    Spouse name: Fritz Pickerel   Number of children: 1   Years of education: Not on file   Highest education level: Not on file  Occupational History   Not on file  Tobacco Use   Smoking status: Never    Passive exposure: Past (ex spouse smoked in home 28 years)   Smokeless tobacco: Never  Vaping Use   Vaping Use: Never used  Substance and Sexual Activity   Alcohol use: Yes    Alcohol/week: 5.0 standard drinks of alcohol    Types: 5 Glasses of wine per week    Comment: wine-3-4 times a week   Drug use: Never   Sexual activity: Not on file  Other Topics Concern   Not on file  Social History Narrative   Lives at home with spouse Fritz Pickerel    Social Determinants of Health   Financial Resource Strain: Not on file  Food Insecurity: Not on file  Transportation Needs: Not  on file  Physical Activity: Not on file  Stress: Not on file  Social Connections: Not on file  Intimate Partner Violence: Not on file     Family History  Problem Relation Age of Onset   Hypertension Mother    Skin cancer Mother    Kidney disease Mother    Gout Mother    Hypertension Father    Heart failure Father    Diabetes Sister    Fibromyalgia Sister    Arrhythmia Sister    Hypertension Brother    Prostate cancer Brother        dx 53s   Lung cancer Brother    Pancreatic cancer Paternal Aunt    Lung cancer Paternal Uncle    Brain cancer Maternal Grandmother 79   Breast cancer Cousin        dx 10s   Breast cancer Other      Current Outpatient Medications:    aspirin EC 81 MG tablet, Take 81  mg by mouth every evening. Swallow whole., Disp: , Rfl:    Calcium Carbonate (CALCIUM 600 PO), Take 1 tablet by mouth in the morning., Disp: , Rfl:    Cholecalciferol (VITAMIN D3 PO), Take 1 tablet by mouth in the morning., Disp: , Rfl:    coconut oil OIL, Apply 1 application topically every other day. AT NIGHT (Applied to feet), Disp: , Rfl:    Coenzyme Q10-Vitamin E (COQ10 ST-100 PO), Take 100 mg by mouth every evening., Disp: , Rfl:    ezetimibe (ZETIA) 10 MG tablet, Take 10 mg by mouth in the morning., Disp: , Rfl:    fexofenadine (ALLEGRA) 180 MG tablet, Take 180 mg by mouth daily., Disp: , Rfl:    isosorbide mononitrate (IMDUR) 30 MG 24 hr tablet, Take 30 mg by mouth in the morning., Disp: , Rfl:    losartan (COZAAR) 50 MG tablet, Take 50 mg by mouth in the morning., Disp: , Rfl:    metoprolol succinate (TOPROL-XL) 50 MG 24 hr tablet, Take 50 mg by mouth every evening. Take with or immediately following a meal., Disp: , Rfl:    potassium citrate (UROCIT-K) 10 MEQ (1080 MG) SR tablet, Take 10 mEq by mouth in the morning., Disp: , Rfl:    rosuvastatin (CRESTOR) 40 MG tablet, Take 40 mg by mouth at bedtime., Disp: , Rfl:    albuterol (VENTOLIN HFA) 108 (90 Base) MCG/ACT inhaler,  Inhale 1-2 puffs into the lungs every 6 (six) hours as needed for wheezing or shortness of breath (HAS NOT USED SINCE 2021). (Patient not taking: Reported on 08/24/2022), Disp: , Rfl:    cephALEXin (KEFLEX) 500 MG capsule, Take 1 capsule (500 mg total) by mouth 3 (three) times daily. (Patient not taking: Reported on 08/24/2022), Disp: 15 capsule, Rfl: 0   Dermatological Products, Misc. (STRATA XRT) GEL, Apply 1 Application topically 3 (three) times daily as needed. (Patient not taking: Reported on 08/24/2022), Disp: 50 g, Rfl: 0   EPINEPHrine 0.3 mg/0.3 mL IJ SOAJ injection, Inject 0.3 mg into the muscle as needed for anaphylaxis. (Patient not taking: Reported on 08/24/2022), Disp: , Rfl:    Physical exam:  Vitals:   08/24/22 1047  BP: (!) 159/84  Pulse: 64  Resp: 17  Temp: 97.9 F (36.6 C)  TempSrc: Oral  SpO2: 100%  Weight: 160 lb (72.6 kg)  Height: 5' 3"  (1.6 m)   Physical Exam Constitutional:      General: She is not in acute distress. Cardiovascular:     Rate and Rhythm: Normal rate and regular rhythm.     Heart sounds: Normal heart sounds.  Pulmonary:     Effort: Pulmonary effort is normal.     Breath sounds: Normal breath sounds.  Skin:    General: Skin is warm and dry.  Neurological:     Mental Status: She is alert and oriented to person, place, and time.           Latest Ref Rng & Units 07/22/2022   12:43 PM  CMP  Glucose 70 - 99 mg/dL 101   BUN 8 - 23 mg/dL 18   Creatinine 0.44 - 1.00 mg/dL 0.84   Sodium 135 - 145 mmol/L 141   Potassium 3.5 - 5.1 mmol/L 3.7   Chloride 98 - 111 mmol/L 107   CO2 22 - 32 mmol/L 25   Calcium 8.9 - 10.3 mg/dL 9.6   Total Protein 6.5 - 8.1 g/dL 7.1   Total Bilirubin 0.3 - 1.2 mg/dL  1.1   Alkaline Phos 38 - 126 U/L 128   AST 15 - 41 U/L 39   ALT 0 - 44 U/L 29       Latest Ref Rng & Units 08/13/2022   11:46 AM  CBC  WBC 4.0 - 10.5 K/uL 4.3   Hemoglobin 12.0 - 15.0 g/dL 13.9   Hematocrit 36.0 - 46.0 % 39.1   Platelets 150 - 400  K/uL 104    Assessment and plan- Patient is a 71 y.o. female with history of ER positive left breast DCIS here to discuss further management  I discussed patient's mammogram and biopsy as well as lumpectomy results.  Patient is already gone through lumpectomy and postlumpectomy radiation treatment.  As per [facility] nomogram her 5-year and 10-year risk of DCIS recurrence without endocrine therapy is at 11% and 7% and it comes down to 5% and 4% with adjuvant endocrine therapy.  However patient's ER expression is weakly positive at 11 to 50% and therefore she may not get full benefit from endocrine therapy but it is worth considering regardless.  Discussed both tamoxifen and AI's as potential options for her.  Discussed potential side effects including all but not limited to hot flashes and mood swings.  Arthralgias and worsening bone health associated with AI's.  Risk of DVT cataracts and uterine cancer associated with tamoxifen.  Patient has already undergone cataract surgery and hysterectomy.  Her baseline bone density scan did show osteopenia in the left femur neck but her 10-year probability of a major osteoporotic fracture was less than 20% and major hip fracture was less than 3%.  I therefore think it would be reasonable for her to consider AI at this time.  If she has significant side effects we will have a low threshold to stop endocrine therapy given that ER expression was 11 to 50% on the tumor.  Treatment will be given with a curative intent.  I will send prescription for Arimidex to her pharmacy.  Patient will also take calcium 1200 mg along with vitamin D 800 international units at the minimum.  She is already on a higher dose of vitamin D given that she has a history of vitamin D deficiency.  I will see her back in 6 weeks for a virtual visit  I noticed on her blood work that she has transient mild thrombocytopenia which I will discuss with her at my next visit   Cancer Staging  Ductal  carcinoma in situ (DCIS) of left breast Staging form: Breast, AJCC 8th Edition - Clinical stage from 08/24/2022: Stage 0 (cTis (DCIS), cN0, cM0, G2, ER+, PR: Not Assessed, HER2: Not Assessed) - Signed by Sindy Guadeloupe, MD on 08/24/2022 Histologic grading system: 3 grade system     Thank you for this kind referral and the opportunity to participate in the care of this  Patient   Visit Diagnosis 1. Osteopenia of neck of left femur   2. Goals of care, counseling/discussion   3. Ductal carcinoma in situ (DCIS) of left breast     Dr. Randa Evens, MD, MPH [facility] [phone] 08/24/2022 3:49 PM

## 2022-08-24 NOTE — Progress Notes (Signed)
Patient here for initial oncology appointment,  concerns of headaches and SOB

## 2022-08-26 ENCOUNTER — Encounter: Payer: Self-pay | Admitting: Oncology

## 2022-08-26 ENCOUNTER — Other Ambulatory Visit: Payer: Self-pay

## 2022-08-26 MED ORDER — ANASTROZOLE 1 MG PO TABS: 1.0000 mg | ORAL_TABLET | Freq: Every day | ORAL | 3 refills | Status: DC

## 2022-09-21 ENCOUNTER — Ambulatory Visit
Admission: RE | Admit: 2022-09-21 | Discharge: 2022-09-21 | Disposition: A | Discharge status: Home / Self Care | Payer: Managed Care, Other (non HMO) | Source: Ambulatory Visit | Attending: Radiation Oncology | Admitting: Radiation Oncology

## 2022-09-21 ENCOUNTER — Encounter: Payer: Self-pay | Admitting: Radiation Oncology

## 2022-09-21 VITALS — BP 171/90 | HR 75 | Temp 98.5°F | Resp 20 | Ht 60.75 in | Wt 160.0 lb

## 2022-09-21 DIAGNOSIS — Z923 Personal history of irradiation: Secondary | ICD-10-CM | POA: Insufficient documentation

## 2022-09-21 DIAGNOSIS — J45909 Unspecified asthma, uncomplicated: Secondary | ICD-10-CM | POA: Insufficient documentation

## 2022-09-21 DIAGNOSIS — D0512 Intraductal carcinoma in situ of left breast: Secondary | ICD-10-CM | POA: Insufficient documentation

## 2022-09-21 DIAGNOSIS — R0602 Shortness of breath: Secondary | ICD-10-CM | POA: Insufficient documentation

## 2022-09-21 DIAGNOSIS — Z170 Estrogen receptor positive status [ER+]: Secondary | ICD-10-CM | POA: Diagnosis not present

## 2022-09-21 NOTE — Progress Notes (Signed)
Radiation Oncology Follow up Note  Name: Becky Mitchell   Date:   09/21/2022 MRN:  [id number] DOB: [birth year]    This 71 y.o. female presents to the clinic today for 1 month follow-up status post radiation therapy to her left breast for ductal carcinoma in situ ER positive.  REFERRING PROVIDER: Gladstone Lighter, MD  HPI: Patient is a 71 year old female now out 1 month having completed whole breast radiation to her left breast for ER positive ductal carcinoma in situ.  Seen today in routine follow-up she is doing fair.  She complains she is has increased shortness of breath went to cardiology last week she does have a cardiac history.  She has been referred to a pulmonologist.  She does have a history of asthma also.  She specifically denies any breast pain or tenderness..  COMPLICATIONS OF TREATMENT: none  FOLLOW UP COMPLIANCE: keeps appointments   PHYSICAL EXAM:  BP (!) 171/90 (BP Location: Right Arm, Patient Position: Sitting, Cuff Size: Normal)   Pulse 75   Temp 98.5 F (36.9 C) (Tympanic)   Resp 20   Ht 5' 0.75" (1.543 m)   Wt 160 lb (72.6 kg)   BMI 30.48 kg/m  Lungs are clear to A&P cardiac examination essentially unremarkable with regular rate and rhythm. No dominant mass or nodularity is noted in either breast in 2 positions examined. Incision is well-healed. No axillary or supraclavicular adenopathy is appreciated. Cosmetic result is excellent.  Well-developed well-nourished patient in NAD. HEENT reveals PERLA, EOMI, discs not visualized.  Oral cavity is clear. No oral mucosal lesions are identified. Neck is clear without evidence of cervical or supraclavicular adenopathy. Lungs are clear to A&P. Cardiac examination is essentially unremarkable with regular rate and rhythm without murmur rub or thrill. Abdomen is benign with no organomegaly or masses noted. Motor sensory and DTR levels are equal and symmetric in the upper and lower extremities. Cranial nerves II through  XII are grossly intact. Proprioception is intact. No peripheral adenopathy or edema is identified. No motor or sensory levels are noted. Crude visual fields are within normal range.  RADIOLOGY RESULTS: No current films for review  PLAN: Present time patient is recovering well from her radiation therapy treatments.  I assured her her lung did not receive sufficient radiation to cause any marked shortness of breath at this time.  She is also declined endocrine therapy.  I have asked to see her back in 6 months for follow-up.  Patient is to call with any concerns.  I would like to take this opportunity to thank you for allowing me to participate in the care of your patient.Noreene Filbert, MD

## 2022-10-05 ENCOUNTER — Encounter: Payer: Self-pay | Admitting: Oncology

## 2022-10-05 ENCOUNTER — Inpatient Hospital Stay: Payer: Managed Care, Other (non HMO) | Attending: Oncology | Admitting: Oncology

## 2022-10-05 DIAGNOSIS — D0512 Intraductal carcinoma in situ of left breast: Secondary | ICD-10-CM

## 2022-10-05 NOTE — Progress Notes (Signed)
I connected with Becky Mitchell on 10/05/22 at  3:15 PM EST by video enabled telemedicine visit and verified that I am speaking with the correct person using two identifiers.   I discussed the limitations, risks, security and privacy concerns of performing an evaluation and management service by telemedicine and the availability of in-person appointments. I also discussed with the patient that there may be a patient responsible charge related to this service. The patient expressed understanding and agreed to proceed.  Other persons participating in the visit and their role in the encounter:  none  Patient's location:  home Provider's location:  work  Stage manager Complaint: Discuss further management of DCIS  History of present illness: Patient is a 71 year old female who had a routine screening mammogram in May 2023 which showed possible asymmetry in the right breast and possible microcalcifications in the left breast.  This was followed by a diagnostic mammogram which showed a 4 mm area of calcifications in the left breast.  Right breast asymmetry was thought to be apposition of normal breast tissue.  Patient had a biopsy of the calcifications which came back positive for at least atypical ductal hyperplasia.  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  ER 11 to 50% positive.  0.7 mm was the closest posterior margin.  Patient was seen by radiation oncology and underwent adjuvant radiation therapy but did not go to reexcision.    Risks and benefits of adjuvant endocrine therapy were discussed with the patient.  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  Interval history patient is doing well postradiation therapy.  Denies any specific complaints at this  time   Review of Systems  Constitutional:  Negative for chills, fever, malaise/fatigue and weight loss.  HENT:  Negative for congestion, ear discharge and nosebleeds.   Eyes:  Negative for blurred vision.  Respiratory:  Negative for cough, hemoptysis, sputum production, shortness of breath and wheezing.   Cardiovascular:  Negative for chest pain, palpitations, orthopnea and claudication.  Gastrointestinal:  Negative for abdominal pain, blood in stool, constipation, diarrhea, heartburn, melena, nausea and vomiting.  Genitourinary:  Negative for dysuria, flank pain, frequency, hematuria and urgency.  Musculoskeletal:  Negative for back pain, joint pain and myalgias.  Skin:  Negative for rash.  Neurological:  Negative for dizziness, tingling, focal weakness, seizures, weakness and headaches.  Endo/Heme/Allergies:  Does not bruise/bleed easily.  Psychiatric/Behavioral:  Negative for depression and suicidal ideas. The patient does not have insomnia.     Allergies  Allergen Reactions   Fire Ant Anaphylaxis   Sulfa Antibiotics Shortness Of Breath and Rash   Bromfenac Other (See Comments)    MADE EYELIDS RED & EYES BLOODSHOT   Loteprednol Etabonate Other (See Comments)    MADE EYELIDS RED & EYES BLOODSHOT    Moxifloxacin Other (See Comments)    EYE DROPS---MADE EYELIDS RED & EYES BLOODSHOT    Latex Dermatitis   Tape Itching    Past Medical History:  Diagnosis Date   Allergies    Anginal pain (HCC)    Arthritis    Asthma    Breast calcification, left    a.) Bx 05/20/2022 --> at least ADH, however features concerning for DCIS   Cataracts, bilateral    a.) s/p extraction 2020  Complication of anesthesia    a.) delayed emergence following routine colonoscopy   Coronary artery disease 04/20/2017   a.) LHC/PCI [facility] 04/20/2017 --> 85% pRCA, 85% pOM --> 2.5 x 12 mm Promus Primere DES to pRCA and 3.0 x 20 mm Promus Primere DES to pLCx; b.) LHC 11/06/2021: EF 55-65%, 25%  p-mLM, 40% o-pLAD, 30-50% p-mLAD --> med mgmt.   Dyspareunia in female    Family history of adverse reaction to anesthesia    a.) delayed emergence in first degree relative (mother)   Fatty liver    Heart murmur    Hyperlipidemia    Hypertension     Past Surgical History:  Procedure Laterality Date   ABDOMINAL HYSTERECTOMY  1975   BREAST BIOPSY Left 04/27/2022   stereo bx/ x clip/ extremely scant and scattered detached groups of ductal epithelial cells in a background of adipose tissue   BREAST BIOPSY Left 05/20/2022   stereo bx, calcs, "COIL" clip-atypical ductal hyperplasia with associated calcifications.  Background mammary parenchyma with prior biopsy site change, fat necrosis, and focal lobular neoplasia.  Negative for invasive carcinoma.  Concerning for DCIS.   BREAST BIOPSY Left 06/12/2022   Procedure: BREAST BIOPSY WITH NEEDLE LOCALIZATION;  Surgeon: Earline Mayotte, MD;  Location: [facility] OR[facility];  Service: General;  Laterality: Left;   BREAST LUMPECTOMY Left 2001   CATARACT EXTRACTION Bilateral 2020   CORONARY ANGIOPLASTY WITH STENT PLACEMENT Left 04/20/2017   Procedure: CORONARY ANGIOPLASTY WITH STENT PLACEMENT; Location: [facility]; Surgeon: Leonette Most, MD   FOOT SURGERY Left    LEFT HEART CATH AND CORONARY ANGIOGRAPHY N/A 11/06/2021   Procedure: LEFT HEART CATH AND CORONARY ANGIOGRAPHY;  Surgeon: Alwyn Pea, MD;  Location: [facility];  Service: Cardiovascular;  Laterality: N/A;   spine cyst aspiration  10/28/2017    Social History   Socioeconomic History   Marital status: Married    Spouse name: Peyton Najjar   Number of children: 1   Years of education: Not on file   Highest education level: Not on file  Occupational History   Not on file  Tobacco Use   Smoking status: Never    Passive exposure: Past (ex spouse smoked in home 28 years)   Smokeless tobacco: Never  Vaping Use   Vaping Use: Never used  Substance and Sexual Activity   Alcohol  use: Yes    Alcohol/week: 5.0 standard drinks of alcohol    Types: 5 Glasses of wine per week    Comment: wine-3-4 times a week   Drug use: Never   Sexual activity: Not on file  Other Topics Concern   Not on file  Social History Narrative   Lives at home with spouse Peyton Najjar    Social Determinants of Health   Financial Resource Strain: Not on file  Food Insecurity: Not on file  Transportation Needs: Not on file  Physical Activity: Not on file  Stress: Not on file  Social Connections: Not on file  Intimate Partner Violence: Not on file    Family History  Problem Relation Age of Onset   Hypertension Mother    Skin cancer Mother    Kidney disease Mother    Gout Mother    Hypertension Father    Heart failure Father    Diabetes Sister    Fibromyalgia Sister    Arrhythmia Sister    Hypertension Brother    Prostate cancer Brother        dx 49s  Lung cancer Brother    Pancreatic cancer Paternal Aunt    Lung cancer Paternal Uncle    Brain cancer Maternal Grandmother 79   Breast cancer Cousin        dx 77s   Breast cancer Other      Current Outpatient Medications:    aspirin EC 81 MG tablet, Take 81 mg by mouth every evening. Swallow whole., Disp: , Rfl:    Calcium Carbonate (CALCIUM 600 PO), Take 1 tablet by mouth in the morning., Disp: , Rfl:    Cholecalciferol (VITAMIN D3 PO), Take 1 tablet by mouth in the morning., Disp: , Rfl:    coconut oil OIL, Apply 1 application topically every other day. AT NIGHT (Applied to feet), Disp: , Rfl:    Coenzyme Q10-Vitamin E (COQ10 ST-100 PO), Take 100 mg by mouth every evening., Disp: , Rfl:    ezetimibe (ZETIA) 10 MG tablet, Take 10 mg by mouth in the morning., Disp: , Rfl:    isosorbide mononitrate (IMDUR) 30 MG 24 hr tablet, Take 30 mg by mouth in the morning., Disp: , Rfl:    losartan (COZAAR) 50 MG tablet, Take 50 mg by mouth in the morning., Disp: , Rfl:    metoprolol succinate (TOPROL-XL) 50 MG 24 hr tablet, Take 50 mg by mouth  every evening. Take with or immediately following a meal., Disp: , Rfl:    potassium citrate (UROCIT-K) 10 MEQ (1080 MG) SR tablet, Take 10 mEq by mouth in the morning., Disp: , Rfl:    rosuvastatin (CRESTOR) 40 MG tablet, Take 40 mg by mouth at bedtime., Disp: , Rfl:    albuterol (VENTOLIN HFA) 108 (90 Base) MCG/ACT inhaler, Inhale 1-2 puffs into the lungs every 6 (six) hours as needed for wheezing or shortness of breath (HAS NOT USED SINCE 2021). (Patient not taking: Reported on 09/21/2022), Disp: , Rfl:    anastrozole (ARIMIDEX) 1 MG tablet, Take 1 tablet (1 mg total) by mouth daily. (Patient not taking: Reported on 09/21/2022), Disp: 30 tablet, Rfl: 3   cephALEXin (KEFLEX) 500 MG capsule, Take 1 capsule (500 mg total) by mouth 3 (three) times daily. (Patient not taking: Reported on 08/24/2022), Disp: 15 capsule, Rfl: 0   EPINEPHrine 0.3 mg/0.3 mL IJ SOAJ injection, Inject 0.3 mg into the muscle as needed for anaphylaxis. (Patient not taking: Reported on 08/24/2022), Disp: , Rfl:    fexofenadine (ALLEGRA) 180 MG tablet, Take 180 mg by mouth daily. (Patient not taking: Reported on 10/05/2022), Disp: , Rfl:   No results found.  No images are attached to the encounter.      Latest Ref Rng & Units 07/22/2022   12:43 PM  CMP  Glucose 70 - 99 mg/dL 101   BUN 8 - 23 mg/dL 18   Creatinine 0.44 - 1.00 mg/dL 0.84   Sodium 135 - 145 mmol/L 141   Potassium 3.5 - 5.1 mmol/L 3.7   Chloride 98 - 111 mmol/L 107   CO2 22 - 32 mmol/L 25   Calcium 8.9 - 10.3 mg/dL 9.6   Total Protein 6.5 - 8.1 g/dL 7.1   Total Bilirubin 0.3 - 1.2 mg/dL 1.1   Alkaline Phos 38 - 126 U/L 128   AST 15 - 41 U/L 39   ALT 0 - 44 U/L 29       Latest Ref Rng & Units 08/13/2022   11:46 AM  CBC  WBC 4.0 - 10.5 K/uL 4.3   Hemoglobin 12.0 - 15.0 g/dL 13.9  Hematocrit 36.0 - 46.0 % 39.1   Platelets 150 - 400 K/uL 104      Observation/objective: Appears in no acute distress over video visit today.  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  This is a routine follow-up visit  ER expression on her DCIS was low at 11 to 50%.  There was still some potential benefit of endocrine therapy if she could take it for 5 years.  However given that AI's are also associated with cardiovascular side effect such as increased risk of ischemic heart disease as well as atherosclerosis patient has decided not to proceed with AI given her prior history of MI requiring stent placement.  Follow-up instructions: I will see her back in 1 year no labs  I discussed the assessment and treatment plan with the patient. The patient was provided an opportunity to ask questions and all were answered. The patient agreed with the plan and demonstrated an understanding of the instructions.   The patient was advised to call back or seek an in-person evaluation if the symptoms worsen or if the condition fails to improve as anticipated.  I provided 12 minutes of face-to-face video visit time during this encounter, and > 50% was spent counseling as documented under my assessment & plan.  Visit Diagnosis: 1. Ductal carcinoma in situ (DCIS) of left breast     Dr. Randa Evens, MD, MPH [place] [facility] Tel- [phone] 10/05/2022 5:00 PM

## 2023-02-03 ENCOUNTER — Other Ambulatory Visit: Payer: Self-pay | Admitting: Internal Medicine

## 2023-02-03 DIAGNOSIS — C50012 Malignant neoplasm of nipple and areola, left female breast: Secondary | ICD-10-CM

## 2023-02-09 ENCOUNTER — Encounter: Payer: Self-pay | Admitting: Internal Medicine

## 2023-02-09 DIAGNOSIS — C50012 Malignant neoplasm of nipple and areola, left female breast: Secondary | ICD-10-CM

## 2023-02-10 ENCOUNTER — Other Ambulatory Visit: Payer: Self-pay | Admitting: Internal Medicine

## 2023-02-10 DIAGNOSIS — C50012 Malignant neoplasm of nipple and areola, left female breast: Secondary | ICD-10-CM

## 2023-03-22 ENCOUNTER — Encounter: Payer: Self-pay | Admitting: Radiation Oncology

## 2023-03-22 ENCOUNTER — Ambulatory Visit
Admission: RE | Admit: 2023-03-22 | Discharge: 2023-03-22 | Disposition: A | Discharge status: Home / Self Care | Payer: Managed Care, Other (non HMO) | Source: Ambulatory Visit | Attending: Radiation Oncology | Admitting: Radiation Oncology

## 2023-03-22 VITALS — BP 151/90 | HR 70 | Temp 98.4°F | Resp 14 | Ht 62.5 in | Wt 162.6 lb

## 2023-03-22 DIAGNOSIS — Z923 Personal history of irradiation: Secondary | ICD-10-CM | POA: Diagnosis not present

## 2023-03-22 DIAGNOSIS — Z170 Estrogen receptor positive status [ER+]: Secondary | ICD-10-CM | POA: Diagnosis not present

## 2023-03-22 DIAGNOSIS — D0512 Intraductal carcinoma in situ of left breast: Secondary | ICD-10-CM

## 2023-03-22 NOTE — Progress Notes (Signed)
Radiation Oncology Follow up Note  Name: Becky Mitchell   Date:   03/22/2023 MRN:  [id number] DOB: 01-Jan-1951    This 72 y.o. female presents to the clinic today for 31-month follow-up status post radiation therapy to her left breast for ductal carcinoma in situ ER positive.  REFERRING PROVIDER: Enid Baas, MD  HPI: Is a 72 year old female now out 7 months having completed whole breast radiation to her left breast for ER positive ductal carcinoma in situ.  Seen today in routine follow-up she is doing well she specifically denies breast tenderness cough or bone pain.  She is opted not to undergo endocrine therapy.  She has a mammogram scheduled for tomorrow..  COMPLICATIONS OF TREATMENT: none  FOLLOW UP COMPLIANCE: keeps appointments   PHYSICAL EXAM:  BP (!) 151/90   Pulse 70   Temp 98.4 F (36.9 C)   Resp 14   Ht 5' 2.5" (1.588 m)   Wt 162 lb 9.6 oz (73.8 kg)   BMI 29.27 kg/m  Lungs are clear to A&P cardiac examination essentially unremarkable with regular rate and rhythm. No dominant mass or nodularity is noted in either breast in 2 positions examined. Incision is well-healed. No axillary or supraclavicular adenopathy is appreciated. Cosmetic result is excellent.  Well-developed well-nourished patient in NAD. HEENT reveals PERLA, EOMI, discs not visualized.  Oral cavity is clear. No oral mucosal lesions are identified. Neck is clear without evidence of cervical or supraclavicular adenopathy. Lungs are clear to A&P. Cardiac examination is essentially unremarkable with regular rate and rhythm without murmur rub or thrill. Abdomen is benign with no organomegaly or masses noted. Motor sensory and DTR levels are equal and symmetric in the upper and lower extremities. Cranial nerves II through XII are grossly intact. Proprioception is intact. No peripheral adenopathy or edema is identified. No motor or sensory levels are noted. Crude visual fields are within normal  range.  RADIOLOGY RESULTS: Will review her mammograms when they become available after tomorrow  PLAN: Present time patient is doing well 7 months out from whole breast radiation and pleased with her overall progress.  Of asked to see her back in 1 year for follow-up.  Patient is to call with any concerns.  I would like to take this opportunity to thank you for allowing me to participate in the care of your patient.Carmina Miller, MD

## 2023-03-22 NOTE — Progress Notes (Signed)
Survivorship Care Plan visit completed.  Treatment summary reviewed and given to patient.  ASCO answers booklet reviewed and given to patient.  CARE program and Cancer Transitions discussed with patient along with other resources cancer center offers to patients and caregivers.  Patient verbalized understanding.    

## 2023-03-23 ENCOUNTER — Ambulatory Visit
Admission: RE | Admit: 2023-03-23 | Discharge: 2023-03-23 | Disposition: A | Discharge status: Home / Self Care | Payer: Managed Care, Other (non HMO) | Source: Ambulatory Visit | Attending: Internal Medicine | Admitting: Internal Medicine

## 2023-03-23 DIAGNOSIS — C50012 Malignant neoplasm of nipple and areola, left female breast: Secondary | ICD-10-CM

## 2023-10-06 ENCOUNTER — Encounter: Payer: Self-pay | Admitting: Oncology

## 2023-10-06 ENCOUNTER — Inpatient Hospital Stay: Payer: Managed Care, Other (non HMO) | Attending: Oncology | Admitting: Oncology

## 2023-10-06 VITALS — BP 162/74 | HR 63 | Temp 96.7°F | Resp 18 | Wt 171.2 lb

## 2023-10-06 DIAGNOSIS — Z801 Family history of malignant neoplasm of trachea, bronchus and lung: Secondary | ICD-10-CM | POA: Diagnosis not present

## 2023-10-06 DIAGNOSIS — Z803 Family history of malignant neoplasm of breast: Secondary | ICD-10-CM | POA: Insufficient documentation

## 2023-10-06 DIAGNOSIS — Z08 Encounter for follow-up examination after completed treatment for malignant neoplasm: Secondary | ICD-10-CM

## 2023-10-06 DIAGNOSIS — Z86000 Personal history of in-situ neoplasm of breast: Secondary | ICD-10-CM

## 2023-10-06 DIAGNOSIS — Z170 Estrogen receptor positive status [ER+]: Secondary | ICD-10-CM | POA: Diagnosis not present

## 2023-10-06 DIAGNOSIS — D0512 Intraductal carcinoma in situ of left breast: Secondary | ICD-10-CM | POA: Diagnosis present

## 2023-10-06 NOTE — Progress Notes (Signed)
Hematology/Oncology Consult note [facility]  [phone] [phone]  Patient Care Team: Enid Baas, MD as PCP - General (Internal Medicine) Carmina Miller, MD as Consulting Physician (Radiation Oncology) Carolan Shiver, MD as Consulting Physician (General Surgery) Creig Hines, MD as Consulting Physician (Oncology)   Name of the [name]  [id number]  [birth year]   Date of visit: 10/06/23  Diagnosis-right breast DCIS ER positive  Chief complaint/ Reason for visit-routine follow-up of DCIS  Heme/Onc history: Patient is a 72 year old female who had a routine screening mammogram in May 2023 which showed possible asymmetry in the right breast and possible microcalcifications in the left breast.  This was followed by a diagnostic mammogram which showed a 4 mm area of calcifications in the left breast.  Right breast asymmetry was thought to be apposition of normal breast tissue.  Patient had a biopsy of the calcifications which came back positive for at least atypical ductal hyperplasia.  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  ER 11 to 50% positive.  0.7 mm was the closest posterior margin.  Patient was seen by radiation oncology and underwent adjuvant radiation therapy but did not go to reexcision.     Risks and benefits of adjuvant endocrine therapy were discussed with the patient.  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  Interval history-reports occasional left chest wall pain when she leans forward in a certain angle but improves on its own when she tells back.  She has also noticed a superficial nodule which has been tender over her left chest wall.  Denies any  breast concerns at this time  ECOG PS- 0 Pain scale- 0   Review of systems- Review of Systems  Constitutional:  Negative for chills, fever, malaise/fatigue and weight loss.  HENT:  Negative for congestion, ear discharge and nosebleeds.   Eyes:  Negative for blurred vision.  Respiratory:  Negative for cough, hemoptysis, sputum production, shortness of breath and wheezing.   Cardiovascular:  Negative for chest pain, palpitations, orthopnea and claudication.  Gastrointestinal:  Negative for abdominal pain, blood in stool, constipation, diarrhea, heartburn, melena, nausea and vomiting.  Genitourinary:  Negative for dysuria, flank pain, frequency, hematuria and urgency.  Musculoskeletal:  Negative for back pain, joint pain and myalgias.       Left chest wall pain  Skin:  Negative for rash.  Neurological:  Negative for dizziness, tingling, focal weakness, seizures, weakness and headaches.  Endo/Heme/Allergies:  Does not bruise/bleed easily.  Psychiatric/Behavioral:  Negative for depression and suicidal ideas. The patient does not have insomnia.       Allergies  Allergen Reactions   Fire Ant Anaphylaxis   Sulfa Antibiotics Shortness Of Breath and Rash   Bromfenac Other (See Comments)    MADE EYELIDS RED & EYES BLOODSHOT   Loteprednol Etabonate Other (See Comments)    MADE EYELIDS RED & EYES BLOODSHOT    Moxifloxacin Other (See Comments)    EYE DROPS---MADE EYELIDS RED & EYES BLOODSHOT    Latex Dermatitis   Tape Itching     Past Medical History:  Diagnosis Date   Allergies    Anginal pain (HCC)    Arthritis    Asthma    Breast calcification, left  a.) Bx 05/20/2022 --> at least ADH, however features concerning for DCIS   Cataracts, bilateral    a.) s/p extraction 2020   Complication of anesthesia    a.) delayed emergence following routine colonoscopy   Coronary artery disease 04/20/2017   a.) LHC/PCI [facility] 04/20/2017 --> 85% pRCA, 85% pOM --> 2.5 x 12 mm  Promus Primere DES to pRCA and 3.0 x 20 mm Promus Primere DES to pLCx; b.) LHC 11/06/2021: EF 55-65%, 25% p-mLM, 40% o-pLAD, 30-50% p-mLAD --> med mgmt.   Dyspareunia in female    Family history of adverse reaction to anesthesia    a.) delayed emergence in first degree relative (mother)   Fatty liver    Heart murmur    Hyperlipidemia    Hypertension      Past Surgical History:  Procedure Laterality Date   ABDOMINAL HYSTERECTOMY  1975   BREAST BIOPSY Left 04/27/2022   stereo bx/ x clip/ extremely scant and scattered detached groups of ductal epithelial cells in a background of adipose tissue   BREAST BIOPSY Left 05/20/2022   stereo bx, calcs, "COIL" clip-atypical ductal hyperplasia with associated calcifications.  Background mammary parenchyma with prior biopsy site change, fat necrosis, and focal lobular neoplasia.  Negative for invasive carcinoma.  Concerning for DCIS.   BREAST BIOPSY Left 06/12/2022   Procedure: BREAST BIOPSY WITH NEEDLE LOCALIZATION;  Surgeon: Earline Mayotte, MD;  Location: [facility] ORS;  Service: General;  Laterality: Left;   BREAST LUMPECTOMY Left 2001   CATARACT EXTRACTION Bilateral 2020   CORONARY ANGIOPLASTY WITH STENT PLACEMENT Left 04/20/2017   Procedure: CORONARY ANGIOPLASTY WITH STENT PLACEMENT; Location: [facility]; Surgeon: Leonette Most, MD   FOOT SURGERY Left    LEFT HEART CATH AND CORONARY ANGIOGRAPHY N/A 11/06/2021   Procedure: LEFT HEART CATH AND CORONARY ANGIOGRAPHY;  Surgeon: Alwyn Pea, MD;  Location: [facility] INVASIVE CV [facility];  Service: Cardiovascular;  Laterality: N/A;   spine cyst aspiration  10/28/2017    Social History   Socioeconomic History   Marital status: Married    Spouse name: Peyton Najjar   Number of children: 1   Years of education: Not on file   Highest education level: Not on file  Occupational History   Not on file  Tobacco Use   Smoking status: Never    Passive exposure: Past (ex spouse smoked in home 28 years)    Smokeless tobacco: Never  Vaping Use   Vaping status: Never Used  Substance and Sexual Activity   Alcohol use: Yes    Alcohol/week: 5.0 standard drinks of alcohol    Types: 5 Glasses of wine per week    Comment: wine-3-4 times a week   Drug use: Never   Sexual activity: Not on file  Other Topics Concern   Not on file  Social History Narrative   Lives at home with spouse Peyton Najjar    Social Determinants of Health   Financial Resource Strain: Low Risk  (08/02/2023)   Received from [facility]   Overall Financial Resource Strain (CARDIA)    Difficulty of Paying Living Expenses: Not hard at all  Food Insecurity: No Food Insecurity (08/02/2023)   Received from [facility]   Hunger Vital Sign    Worried About Running Out of Food in the Last Year: Never true    Ran Out of Food in the Last Year: Never true  Transportation Needs: No Transportation Needs (08/02/2023)   Received from [facility]   PRAPARE - Transportation    In the past 12 months, has lack of transportation kept you from medical appointments or from getting medications?: No    Lack of Transportation (Non-Medical): No  Physical Activity: Not on file  Stress: Not on file  Social Connections: Unknown (01/13/2023)   Received from [facility]   Social Network    Social Network: Not on file  Intimate Partner Violence: Unknown (01/13/2023)   Received from [facility]   HITS    Physically Hurt: Not on file    Insult or Talk Down To: Not on file    Threaten Physical Harm: Not on file    Scream or Curse: Not on file    Family History  Problem Relation Age of Onset   Hypertension Mother    Skin cancer Mother    Kidney disease Mother    Gout Mother    Hypertension Father    Heart failure Father    Diabetes Sister    Fibromyalgia Sister    Arrhythmia Sister    Hypertension Brother    Prostate cancer Brother        dx 42s   Lung cancer  Brother    Pancreatic cancer Paternal Aunt    Lung cancer Paternal Uncle    Brain cancer Maternal Grandmother 45   Breast cancer Cousin        dx 8s   Breast cancer Other      Current Outpatient Medications:    aspirin EC 81 MG tablet, Take 81 mg by mouth every evening. Swallow whole., Disp: , Rfl:    Calcium Carbonate (CALCIUM 600 PO), Take 1 tablet by mouth in the morning., Disp: , Rfl:    Cholecalciferol (VITAMIN D3 PO), Take 1 tablet by mouth in the morning., Disp: , Rfl:    coconut oil OIL, Apply 1 application topically every other day. AT NIGHT (Applied to feet), Disp: , Rfl:    Coenzyme Q10-Vitamin E (COQ10 ST-100 PO), Take 100 mg by mouth every evening., Disp: , Rfl:    ezetimibe (ZETIA) 10 MG tablet, Take 10 mg by mouth in the morning., Disp: , Rfl:    losartan (COZAAR) 50 MG tablet, Take 50 mg by mouth in the morning., Disp: , Rfl:    metoprolol succinate (TOPROL-XL) 50 MG 24 hr tablet, Take 50 mg by mouth every evening. Take with or immediately following a meal., Disp: , Rfl:    potassium chloride (KLOR-CON M) 10 MEQ tablet, Take by mouth., Disp: , Rfl:    potassium citrate (UROCIT-K) 10 MEQ (1080 MG) SR tablet, Take 10 mEq by mouth in the morning., Disp: , Rfl:    rosuvastatin (CRESTOR) 40 MG tablet, Take 40 mg by mouth at bedtime., Disp: , Rfl:    albuterol (VENTOLIN HFA) 108 (90 Base) MCG/ACT inhaler, Inhale 1-2 puffs into the lungs every 6 (six) hours as needed for wheezing or shortness of breath (HAS NOT USED SINCE 2021). (Patient not taking: Reported on 09/21/2022), Disp: , Rfl:    anastrozole (ARIMIDEX) 1 MG tablet, Take 1 tablet (1 mg total) by mouth daily. (Patient not taking: Reported on 09/21/2022), Disp: 30 tablet, Rfl: 3   cephALEXin (KEFLEX) 500 MG capsule, Take 1 capsule (500 mg total) by mouth 3 (three) times daily. (Patient not taking: Reported on 08/24/2022), Disp: 15 capsule, Rfl: 0   EPINEPHrine 0.3 mg/0.3 mL IJ SOAJ injection, Inject 0.3 mg into the muscle as  needed for anaphylaxis. (  Patient not taking: Reported on 08/24/2022), Disp: , Rfl:    fexofenadine (ALLEGRA) 180 MG tablet, Take 180 mg by mouth daily. (Patient not taking: Reported on 10/05/2022), Disp: , Rfl:    isosorbide mononitrate (IMDUR) 30 MG 24 hr tablet, Take 30 mg by mouth in the morning., Disp: , Rfl:   Physical exam:  Vitals:   10/06/23 1138  BP: (!) 162/74  Pulse: 63  Resp: 18  Temp: (!) 96.7 F (35.9 C)  TempSrc: Tympanic  SpO2: 100%  Weight: 171 lb 3.2 oz (77.7 kg)   Physical Exam Cardiovascular:     Rate and Rhythm: Normal rate and regular rhythm.     Heart sounds: Normal heart sounds.  Pulmonary:     Effort: Pulmonary effort is normal.     Breath sounds: Normal breath sounds.  Musculoskeletal:     Comments: There is a solitary palpable erythematous swelling over the left chest wall along the midclavicular line compatible with folliculitis  Skin:    General: Skin is warm and dry.  Neurological:     Mental Status: She is alert and oriented to person, place, and time.         Latest Ref Rng & Units 07/22/2022   12:43 PM  CMP  Glucose 70 - 99 mg/dL 161   BUN 8 - 23 mg/dL 18   Creatinine 0.96 - 1.00 mg/dL 0.45   Sodium 409 - 811 mmol/L 141   Potassium 3.5 - 5.1 mmol/L 3.7   Chloride 98 - 111 mmol/L 107   CO2 22 - 32 mmol/L 25   Calcium 8.9 - 10.3 mg/dL 9.6   Total Protein 6.5 - 8.1 g/dL 7.1   Total Bilirubin 0.3 - 1.2 mg/dL 1.1   Alkaline Phos 38 - 126 U/L 128   AST 15 - 41 U/L 39   ALT 0 - 44 U/L 29       Latest Ref Rng & Units 08/13/2022   11:46 AM  CBC  WBC 4.0 - 10.5 K/uL 4.3   Hemoglobin 12.0 - 15.0 g/dL 91.4   Hematocrit 78.2 - 46.0 % 39.1   Platelets 150 - 400 K/uL 104      Assessment and plan- Patient is a 72 y.o. female with weakly ER positive left breast DCIS status postlumpectomy and adjuvant radiation therapy.  She is not on any endocrine therapy.  This is a routine follow-up visit  Clinically patient is doing well with no concerning  signs and symptoms of recurrence based on today's exam.  Her mammogram from May 2024 was unremarkable.  I will see her back in 1 year no labs given that she is not on any endocrine therapy.  Suspect solitary swelling noted over her left chest wall is compatible with an area of folliculitis.  Patient states that she has amoxicillin that was given to her recently after a dental intervention it would be okay for her to take this antibiotic for 5 to 7 days and see if the swelling gets better.  She can also use topical triple antibiotic ointment for the same    Visit Diagnosis 1. Encounter for follow-up surveillance of ductal carcinoma in situ (DCIS) of breast      Dr. Owens Shark, MD, MPH [facility] [phone] 10/06/2023 1:45 PM

## 2024-01-04 ENCOUNTER — Encounter: Payer: Self-pay | Admitting: Oncology

## 2024-01-04 NOTE — Telephone Encounter (Signed)
Can you look into her options that she has given and respond to her query. She had dcis 11-50% ER + so not strongly er driven. She is no on AI. I am ok with topical estrogen to start with but would have reservations with the use of oral HRT

## 2024-01-18 ENCOUNTER — Inpatient Hospital Stay: Payer: Self-pay | Attending: Nurse Practitioner | Admitting: Nurse Practitioner

## 2024-01-18 ENCOUNTER — Encounter: Payer: Self-pay | Admitting: Nurse Practitioner

## 2024-01-18 VITALS — BP 165/74 | HR 67 | Temp 97.5°F | Resp 16 | Wt 167.0 lb

## 2024-01-18 DIAGNOSIS — N941 Unspecified dyspareunia: Secondary | ICD-10-CM | POA: Insufficient documentation

## 2024-01-18 DIAGNOSIS — N9411 Superficial (introital) dyspareunia: Secondary | ICD-10-CM

## 2024-01-18 DIAGNOSIS — D0512 Intraductal carcinoma in situ of left breast: Secondary | ICD-10-CM | POA: Insufficient documentation

## 2024-01-18 MED ORDER — ESTRADIOL 0.1 MG/GM VA CREA: TOPICAL_CREAM | VAGINAL | 2 refills | Status: AC

## 2024-01-18 MED ORDER — DIAZEPAM 5 MG PO TABS: ORAL_TABLET | ORAL | 0 refills | Status: DC

## 2024-01-18 NOTE — Progress Notes (Signed)
 Symptom Management Clinic  [facility] at [facility] A Department of the [place]. [facility] [address], [address] [place], [place] [address] [phone] (phone) [phone] (fax)  Patient Care Team: Enid Baas, MD as PCP - General (Internal Medicine) Carmina Miller, MD as Consulting Physician (Radiation Oncology) Carolan Shiver, MD as Consulting Physician (General Surgery) Creig Hines, MD as Consulting Physician (Oncology)   Name of the [name]  [id number]  Apr 29, 1951   Date of visit: 01/18/24  Diagnosis- DCIS  Chief complaint/ Reason for visit- Dyspareunia  Heme/Onc history:  Oncology History  Ductal carcinoma in situ (DCIS) of left breast  08/24/2022 Initial Diagnosis   Ductal carcinoma in situ (DCIS) of left breast   08/24/2022 Cancer Staging   Staging form: Breast, AJCC 8th Edition - Clinical stage from 08/24/2022: Stage 0 (cTis (DCIS), cN0, cM0, G2, ER+, PR: Not Assessed, HER2: Not Assessed) - Signed by Creig Hines, MD on 08/24/2022 Histologic grading system: 3 grade system     Interval history- Shary Evangeline Causey is a 73 y.o. female with above history of DCIS, not on AI d/t weakly ER positivity and concern for cardiac risks, who presents to symptom management clinic for concerns of dyspareunia. This is a chronic condition but she due to health changes of her spouse, she is interested in revisiting management options. She explains that she had an episiotomy during childbirth with surgical repair then later revision. She's had chronic pain at the site of the scar tissue, primarily perineum and vaginal introitus. Describes as moderate to severe and penetrative sex results in tearing and bleeding. Her symptoms have worsened as she became post menopausal and she was taken off oral HRT with DCIS diagnosis. She has tried several lubricants and dilator therapy as well as pelvic floor physical therapy.  She feels that none of these have significantly improved her symptoms. She has increased anxiety with intercourse d/t her fear of pain. She was previously evaluated by gynecology for this and was noted to have muscle spasms of right obturator and bilateral levator ani spasms. She has had a hysterectomy.   Review of systems- Review of Systems  Constitutional:  Negative for malaise/fatigue and weight loss.  Gastrointestinal:  Negative for abdominal pain, blood in stool, constipation, diarrhea and melena.  Genitourinary:  Negative for dysuria, flank pain, frequency, hematuria and urgency.       Denies vaginal bleeding, spotting, discharge.   Psychiatric/Behavioral:  Negative for depression. The patient is not nervous/anxious.      Allergies  Allergen Reactions   Fire Ant Anaphylaxis   Sulfa Antibiotics Shortness Of Breath and Rash   Bromfenac Other (See Comments)    MADE EYELIDS RED & EYES BLOODSHOT   Loteprednol Etabonate Other (See Comments)    MADE EYELIDS RED & EYES BLOODSHOT    Moxifloxacin Other (See Comments)    EYE DROPS---MADE EYELIDS RED & EYES BLOODSHOT    Latex Dermatitis   Tape Itching    Past Medical History:  Diagnosis Date   Allergies    Anginal pain (HCC)    Arthritis    Asthma    Breast calcification, left    a.) Bx 05/20/2022 --> at least ADH, however features concerning for DCIS   Cataracts, bilateral    a.) s/p extraction 2020   Complication of anesthesia    a.) delayed emergence following routine colonoscopy   Coronary artery disease 04/20/2017   a.) LHC/PCI [facility] 04/20/2017 --> 85%  pRCA, 85% pOM --> 2.5 x 12 mm Promus Primere DES to pRCA and 3.0 x 20 mm Promus Primere DES to pLCx; b.) LHC 11/06/2021: EF 55-65%, 25% p-mLM, 40% o-pLAD, 30-50% p-mLAD --> med mgmt.   Dyspareunia in female    Family history of adverse reaction to anesthesia    a.) delayed emergence in first degree relative (mother)   Fatty liver    Heart murmur     Hyperlipidemia    Hypertension     Past Surgical History:  Procedure Laterality Date   ABDOMINAL HYSTERECTOMY  1975   BREAST BIOPSY Left 04/27/2022   stereo bx/ x clip/ extremely scant and scattered detached groups of ductal epithelial cells in a background of adipose tissue   BREAST BIOPSY Left 05/20/2022   stereo bx, calcs, "COIL" clip-atypical ductal hyperplasia with associated calcifications.  Background mammary parenchyma with prior biopsy site change, fat necrosis, and focal lobular neoplasia.  Negative for invasive carcinoma.  Concerning for DCIS.   BREAST BIOPSY Left 06/12/2022   Procedure: BREAST BIOPSY WITH NEEDLE LOCALIZATION;  Surgeon: Earline Mayotte, MD;  Location: [facility] ORS;  Service: General;  Laterality: Left;   BREAST LUMPECTOMY Left 2001   CATARACT EXTRACTION Bilateral 2020   CORONARY ANGIOPLASTY WITH STENT PLACEMENT Left 04/20/2017   Procedure: CORONARY ANGIOPLASTY WITH STENT PLACEMENT; Location: [facility]; Surgeon: Leonette Most, MD   FOOT SURGERY Left    LEFT HEART CATH AND CORONARY ANGIOGRAPHY N/A 11/06/2021   Procedure: LEFT HEART CATH AND CORONARY ANGIOGRAPHY;  Surgeon: Alwyn Pea, MD;  Location: [facility] INVASIVE CV LAB;  Service: Cardiovascular;  Laterality: N/A;   spine cyst aspiration  10/28/2017    Social History   Socioeconomic History   Marital status: Married    Spouse name: Peyton Najjar   Number of children: 1   Years of education: Not on file   Highest education level: Not on file  Occupational History   Not on file  Tobacco Use   Smoking status: Never    Passive exposure: Past (ex spouse smoked in home 28 years)   Smokeless tobacco: Never  Vaping Use   Vaping status: Never Used  Substance and Sexual Activity   Alcohol use: Yes    Alcohol/week: 5.0 standard drinks of alcohol    Types: 5 Glasses of wine per week    Comment: wine-3-4 times a week   Drug use: Never   Sexual activity: Not on file  Other Topics Concern   Not on file   Social History Narrative   Lives at home with spouse Peyton Najjar    Social Drivers of Health   Financial Resource Strain: Low Risk  (08/02/2023)   Received from [facility]   Overall Financial Resource Strain (CARDIA)    Difficulty of Paying Living Expenses: Not hard at all  Food Insecurity: No Food Insecurity (08/02/2023)   [facility]   Hunger Vital Sign    Worried About Running Out of Food in the Last Year: Never true    Ran Out of Food in the Last Year: Never true  Transportation Needs: No Transportation Needs (08/02/2023)   Received from [facility]   PRAPARE - Transportation    Lack of Transportation (Non-Medical): No    In the past 12 months, has lack of transportation kept you from medical appointments or from getting medications?: No  Physical Activity: Not on file  Stress: Not on file  Social Connections: Unknown (01/13/2023)  Received from [facility]   Social Network    Social Network: Not on file  Intimate Partner Violence: Unknown (01/13/2023)   Received from [facility], [facility]   HITS    Physically Hurt: Not on file    Insult or Talk Down To: Not on file    Threaten Physical Harm: Not on file    Scream or Curse: Not on file    Family History  Problem Relation Age of Onset   Hypertension Mother    Skin cancer Mother    Kidney disease Mother    Gout Mother    Hypertension Father    Heart failure Father    Diabetes Sister    Fibromyalgia Sister    Arrhythmia Sister    Hypertension Brother    Prostate cancer Brother        dx 81s   Lung cancer Brother    Pancreatic cancer Paternal Aunt    Lung cancer Paternal Uncle    Brain cancer Maternal Grandmother 29   Breast cancer Cousin        dx 39s   Breast cancer Other     Current Outpatient Medications:    diazepam (VALIUM) 5 MG tablet, Apply few drops of water to tablet then insert high into vagina approximately 45  minutes prior to dilator exercise or intercourse., Disp: 30 tablet, Rfl: 0   estradiol (ESTRACE VAGINAL) 0.1 MG/GM vaginal cream, Place 1 Applicatorful vaginally at bedtime for 14 days, THEN 1 Applicatorful 3 (three) times a week for 16 days., Disp: 42.5 g, Rfl: 2   albuterol (VENTOLIN HFA) 108 (90 Base) MCG/ACT inhaler, Inhale 1-2 puffs into the lungs every 6 (six) hours as needed for wheezing or shortness of breath (HAS NOT USED SINCE 2021). (Patient not taking: Reported on 09/21/2022), Disp: , Rfl:    aspirin EC 81 MG tablet, Take 81 mg by mouth every evening. Swallow whole., Disp: , Rfl:    Calcium Carbonate (CALCIUM 600 PO), Take 1 tablet by mouth in the morning., Disp: , Rfl:    Cholecalciferol (VITAMIN D3 PO), Take 1 tablet by mouth in the morning., Disp: , Rfl:    coconut oil OIL, Apply 1 application topically every other day. AT NIGHT (Applied to feet), Disp: , Rfl:    Coenzyme Q10-Vitamin E (COQ10 ST-100 PO), Take 100 mg by mouth every evening., Disp: , Rfl:    EPINEPHrine 0.3 mg/0.3 mL IJ SOAJ injection, Inject 0.3 mg into the muscle as needed for anaphylaxis. (Patient not taking: Reported on 08/24/2022), Disp: , Rfl:    ezetimibe (ZETIA) 10 MG tablet, Take 10 mg by mouth in the morning., Disp: , Rfl:    losartan (COZAAR) 50 MG tablet, Take 50 mg by mouth in the morning., Disp: , Rfl:    metoprolol succinate (TOPROL-XL) 50 MG 24 hr tablet, Take 50 mg by mouth every evening. Take with or immediately following a meal., Disp: , Rfl:    potassium chloride (KLOR-CON M) 10 MEQ tablet, Take by mouth., Disp: , Rfl:    potassium citrate (UROCIT-K) 10 MEQ (1080 MG) SR tablet, Take 10 mEq by mouth in the morning., Disp: , Rfl:    rosuvastatin (CRESTOR) 40 MG tablet, Take 40 mg by mouth at bedtime., Disp: , Rfl:   Physical exam:  Vitals:   01/18/24 0959  BP: (!) 165/74  Pulse: 67  Resp: 16  Temp: (!) 97.5 F (36.4 C)  TempSrc: Tympanic  SpO2: 100%  Weight: 167 lb (  75.8 kg)   Physical  Exam Vitals reviewed.  Constitutional:      Appearance: She is not ill-appearing.  Pulmonary:     Effort: Pulmonary effort is normal.  Abdominal:     General: There is no distension.  Neurological:     Mental Status: She is alert and oriented to person, place, and time.  Psychiatric:        Mood and Affect: Mood normal.        Behavior: Behavior normal.     Assessment and plan- Patient is a 73 y.o. female   Dyspareunia- secondary to what I suspect is muscle spasms based on clinical symptoms as well as reduced elasticity of scar tissue post operatively. Refractory to pelvic floor physical therapy, vaginal dilators, and vaginal moisturizers. She did have some improvement when taking oral HRT but now discontinued d/t DCIS diagnosis. Today, we talked about the anatomy of the pelvic floor including activation of pain cycle and muscle spasms and how this relates to chronic pain cycle. I recommend initiating topical estrogen and sent prescription for estrace cream, insert 1 applicatorful in to vagina nightly for 2 weeks then reduce to 3 nights a week. We reviewed that topical estrogen should have low systemic absorption and her DCIS was weakly ER positive therefore benefit may outweigh risk. She is in agreement and would like to trial estrogen. Also recommended day time vaginal moisturizes such as vitamin e which can be applied to perineal tissue, labia, and vagina. This soaks in to the tissue to help improve elasticity. She has tried dilators in the past but I'd recommend warm/body temperature silicone dilator, small enough size as to not trigger muscle spasm. Approximately 45-60 min prior to dilator exercise insert 1 tablet of valium high int o the vagina. Use a few drops of water to tablet to dissolve coating. This will help relax the muscles of the pelvic floor. Reviewed potential for drowsiness. Can also consider viscous lidocaine at vaginal introitus at next visit. May also consider Drue Stager Touch  CO2 laser with uro-gyn. Recommend positions that allow her to control depth of penetration. Recommended she review pelvic floor PT exercises. Can also consider injection of botulinum toxin to affected muscles. I suspect she also has a component of anxiety due to chronic issues and ongoing trauma and pain. May consider systemic anxiolytics in the future.   Disposition:  Rtc in 4 weeks for reevaluation   Visit Diagnosis 1. Introital dyspareunia    Patient expressed understanding and was in agreement with this plan. She also understands that She can call clinic at any time with any questions, concerns, or complaints.   Thank you for allowing me to participate in the care of this very pleasant patient.   Consuello Masse, DNP, AGNP-C, AOCNP [facility] [phone]  CC: Dr Smith Robert

## 2024-01-27 DIAGNOSIS — C50012 Malignant neoplasm of nipple and areola, left female breast: Secondary | ICD-10-CM | POA: Diagnosis not present

## 2024-01-27 DIAGNOSIS — I10 Essential (primary) hypertension: Secondary | ICD-10-CM | POA: Diagnosis not present

## 2024-01-27 DIAGNOSIS — R8290 Unspecified abnormal findings in urine: Secondary | ICD-10-CM | POA: Diagnosis not present

## 2024-01-27 DIAGNOSIS — R739 Hyperglycemia, unspecified: Secondary | ICD-10-CM | POA: Diagnosis not present

## 2024-02-03 DIAGNOSIS — I25119 Atherosclerotic heart disease of native coronary artery with unspecified angina pectoris: Secondary | ICD-10-CM | POA: Diagnosis not present

## 2024-02-03 DIAGNOSIS — I10 Essential (primary) hypertension: Secondary | ICD-10-CM | POA: Diagnosis not present

## 2024-02-03 DIAGNOSIS — C50012 Malignant neoplasm of nipple and areola, left female breast: Secondary | ICD-10-CM | POA: Diagnosis not present

## 2024-02-03 DIAGNOSIS — Z0000 Encounter for general adult medical examination without abnormal findings: Secondary | ICD-10-CM | POA: Diagnosis not present

## 2024-02-03 DIAGNOSIS — U071 COVID-19: Secondary | ICD-10-CM | POA: Diagnosis not present

## 2024-02-08 ENCOUNTER — Other Ambulatory Visit: Payer: Self-pay | Admitting: General Surgery

## 2024-02-08 DIAGNOSIS — Z853 Personal history of malignant neoplasm of breast: Secondary | ICD-10-CM

## 2024-02-15 ENCOUNTER — Inpatient Hospital Stay: Attending: Nurse Practitioner | Admitting: Nurse Practitioner

## 2024-02-15 VITALS — BP 142/80 | HR 68 | Temp 98.0°F | Resp 12 | Wt 164.8 lb

## 2024-02-15 DIAGNOSIS — D0512 Intraductal carcinoma in situ of left breast: Secondary | ICD-10-CM | POA: Diagnosis not present

## 2024-02-15 DIAGNOSIS — N941 Unspecified dyspareunia: Secondary | ICD-10-CM | POA: Insufficient documentation

## 2024-02-15 DIAGNOSIS — N9411 Superficial (introital) dyspareunia: Secondary | ICD-10-CM | POA: Diagnosis not present

## 2024-02-15 NOTE — Progress Notes (Unsigned)
 Symptom Management Clinic  [facility] A Department of the [place]. [facility] [address] [place], [place] [address] [phone] (phone) [phone] (fax)  Patient Care Team: Enid Baas, MD as PCP - General (Internal Medicine) Carmina Miller, MD as Consulting Physician (Radiation Oncology) Carolan Shiver, MD as Consulting Physician (General Surgery) Creig Hines, MD as Consulting Physician (Oncology)   Name of the [name]  [id number]  12-Jun-1951   Date of visit: 02/15/24  Diagnosis- DCIS  Chief complaint/ Reason for visit- Dyspareunia  Heme/Onc history:  Oncology History  Ductal carcinoma in situ (DCIS) of left breast  08/24/2022 Initial Diagnosis   Ductal carcinoma in situ (DCIS) of left breast   08/24/2022 Cancer Staging   Staging form: Breast, AJCC 8th Edition - Clinical stage from 08/24/2022: Stage 0 (cTis (DCIS), cN0, cM0, G2, ER+, PR: Not Assessed, HER2: Not Assessed) - Signed by Creig Hines, MD on 08/24/2022 Histologic grading system: 3 grade system     Interval history- Becky Mitchell is a 73 y.o. female with above history of DCIS, not on AI d/t weakly ER positivity and concern for cardiac risks, who returns to clinic for follow up for dyspareunia. She has used estrogen topically for a couple of weeks. She has discontinued d/t interval health changes to her husband and hospitalization. She has not tried vaginal moisturizers. Pain is unchanged.   Review of systems- Review of Systems  Constitutional:  Negative for malaise/fatigue and weight loss.  Gastrointestinal:  Negative for abdominal pain, blood in stool, constipation, diarrhea and melena.  Genitourinary:  Negative for dysuria, flank pain, frequency, hematuria and urgency.       Denies vaginal bleeding, spotting, discharge.   Psychiatric/Behavioral:  Negative for depression. The patient is not  nervous/anxious.      Allergies  Allergen Reactions   Fire Ant Anaphylaxis   Sulfa Antibiotics Shortness Of Breath and Rash   Bromfenac Other (See Comments)    MADE EYELIDS RED & EYES BLOODSHOT   Loteprednol Etabonate Other (See Comments)    MADE EYELIDS RED & EYES BLOODSHOT    Moxifloxacin Other (See Comments)    EYE DROPS---MADE EYELIDS RED & EYES BLOODSHOT    Latex Dermatitis   Tape Itching    Past Medical History:  Diagnosis Date   Allergies    Anginal pain (HCC)    Arthritis    Asthma    Breast calcification, left    a.) Bx 05/20/2022 --> at least ADH, however features concerning for DCIS   Cataracts, bilateral    a.) s/p extraction 2020   Complication of anesthesia    a.) delayed emergence following routine colonoscopy   Coronary artery disease 04/20/2017   a.) LHC/PCI [facility] 04/20/2017 --> 85% pRCA, 85% pOM --> 2.5 x 12 mm Promus Primere DES to pRCA and 3.0 x 20 mm Promus Primere DES to pLCx; b.) LHC 11/06/2021: EF 55-65%, 25% p-mLM, 40% o-pLAD, 30-50% p-mLAD --> med mgmt.   Dyspareunia in female    Family history of adverse reaction to anesthesia    a.) delayed emergence in first degree relative (mother)   Fatty liver    Heart murmur    Hyperlipidemia    Hypertension     Past Surgical History:  Procedure Laterality Date   ABDOMINAL HYSTERECTOMY  1975   BREAST BIOPSY Left 04/27/2022   stereo bx/ x clip/ extremely scant and scattered detached groups of ductal epithelial cells  in a background of adipose tissue   BREAST BIOPSY Left 05/20/2022   stereo bx, calcs, "COIL" clip-atypical ductal hyperplasia with associated calcifications.  Background mammary parenchyma with prior biopsy site change, fat necrosis, and focal lobular neoplasia.  Negative for invasive carcinoma.  Concerning for DCIS.   BREAST BIOPSY Left 06/12/2022   Procedure: BREAST BIOPSY WITH NEEDLE LOCALIZATION;  Surgeon: Earline Mayotte, MD;  Location: [facility] ORS;  Service: General;   Laterality: Left;   BREAST LUMPECTOMY Left 2001   CATARACT EXTRACTION Bilateral 2020   CORONARY ANGIOPLASTY WITH STENT PLACEMENT Left 04/20/2017   Procedure: CORONARY ANGIOPLASTY WITH STENT PLACEMENT; Location: [facility]; Surgeon: Leonette Most, MD   FOOT SURGERY Left    LEFT HEART CATH AND CORONARY ANGIOGRAPHY N/A 11/06/2021   Procedure: LEFT HEART CATH AND CORONARY ANGIOGRAPHY;  Surgeon: Alwyn Pea, MD;  Location: [facility] INVASIVE CV LAB;  Service: Cardiovascular;  Laterality: N/A;   spine cyst aspiration  10/28/2017    Social History   Socioeconomic History   Marital status: Married    Spouse name: Peyton Najjar   Number of children: 1   Years of education: Not on file   Highest education level: Not on file  Occupational History   Not on file  Tobacco Use   Smoking status: Never    Passive exposure: Past (ex spouse smoked in home 28 years)   Smokeless tobacco: Never  Vaping Use   Vaping status: Never Used  Substance and Sexual Activity   Alcohol use: Yes    Alcohol/week: 5.0 standard drinks of alcohol    Types: 5 Glasses of wine per week    Comment: wine-3-4 times a week   Drug use: Never   Sexual activity: Not on file  Other Topics Concern   Not on file  Social History Narrative   Lives at home with spouse Peyton Najjar    Social Drivers of Health   Financial Resource Strain: Low Risk  (02/03/2024)   Received from [facility]   Overall Financial Resource Strain (CARDIA)    Difficulty of Paying Living Expenses: Not very hard  Food Insecurity: No Food Insecurity (02/03/2024)   Received from [facility]   Hunger Vital Sign    Worried About Running Out of Food in the Last Year: Never true    Ran Out of Food in the Last Year: Never true  Transportation Needs: No Transportation Needs (02/03/2024)   [facility]    In the past 12 months, has lack of transportation kept you from  medical appointments or from getting medications?: No    Lack of Transportation (Non-Medical): No  Physical Activity: Not on file  Stress: Not on file  Social Connections: Unknown (01/13/2023)   Received from [facility]   Social Network    Social Network: Not on file  Intimate Partner Violence: Unknown (01/13/2023)   Received from [facility]   HITS    Physically Hurt: Not on file    Insult or Talk Down To: Not on file    Threaten Physical Harm: Not on file    Scream or Curse: Not on file    Family History  Problem Relation Age of Onset   Hypertension Mother    Skin cancer Mother    Kidney disease Mother    Gout Mother    Hypertension Father    Heart failure Father    Diabetes Sister  Fibromyalgia Sister    Arrhythmia Sister    Hypertension Brother    Prostate cancer Brother        dx 42s   Lung cancer Brother    Pancreatic cancer Paternal Aunt    Lung cancer Paternal Uncle    Brain cancer Maternal Grandmother 46   Breast cancer Cousin        dx 59s   Breast cancer Other     Current Outpatient Medications:    Ascorbic Acid (VITAMIN C) 1000 MG tablet, Take 1,000 mg by mouth daily., Disp: , Rfl:    aspirin EC 81 MG tablet, Take 81 mg by mouth every evening. Swallow whole., Disp: , Rfl:    Cholecalciferol (VITAMIN D3 PO), Take 1 tablet by mouth in the morning., Disp: , Rfl:    Coenzyme Q10-Vitamin E (COQ10 ST-100 PO), Take 100 mg by mouth every evening., Disp: , Rfl:    diazepam (VALIUM) 5 MG tablet, Apply few drops of water to tablet then insert high into vagina approximately 45 minutes prior to dilator exercise or intercourse., Disp: 30 tablet, Rfl: 0   EPINEPHrine 0.3 mg/0.3 mL IJ SOAJ injection, Inject 0.3 mg into the muscle as needed for anaphylaxis., Disp: , Rfl:    estradiol (ESTRACE VAGINAL) 0.1 MG/GM vaginal cream, Place 1 Applicatorful vaginally at bedtime for 14 days, THEN 1 Applicatorful 3 (three) times a week for 16 days.,  Disp: 42.5 g, Rfl: 2   ezetimibe (ZETIA) 10 MG tablet, Take 10 mg by mouth in the morning., Disp: , Rfl:    losartan (COZAAR) 50 MG tablet, Take 50 mg by mouth in the morning., Disp: , Rfl:    metoprolol succinate (TOPROL-XL) 50 MG 24 hr tablet, Take 50 mg by mouth every evening. Take with or immediately following a meal., Disp: , Rfl:    potassium chloride (KLOR-CON M) 10 MEQ tablet, Take by mouth., Disp: , Rfl:    rosuvastatin (CRESTOR) 40 MG tablet, Take 40 mg by mouth at bedtime., Disp: , Rfl:    stavudine (ZERIT) 20 MG capsule, Take 20 mg by mouth daily., Disp: , Rfl:    albuterol (VENTOLIN HFA) 108 (90 Base) MCG/ACT inhaler, Inhale 1-2 puffs into the lungs every 6 (six) hours as needed for wheezing or shortness of breath (HAS NOT USED SINCE 2021). (Patient not taking: Reported on 09/21/2022), Disp: , Rfl:    Calcium Carbonate (CALCIUM 600 PO), Take 1 tablet by mouth in the morning. (Patient not taking: Reported on 02/15/2024), Disp: , Rfl:    coconut oil OIL, Apply 1 application topically every other day. AT NIGHT (Applied to feet) (Patient not taking: Reported on 02/15/2024), Disp: , Rfl:    potassium citrate (UROCIT-K) 10 MEQ (1080 MG) SR tablet, Take 10 mEq by mouth in the morning. (Patient not taking: Reported on 02/15/2024), Disp: , Rfl:   Physical exam:  Vitals:   02/15/24 1034  BP: (!) 142/80  Pulse: 68  Resp: 12  Temp: 98 F (36.7 C)  TempSrc: Tympanic  SpO2: 100%  Weight: 164 lb 12.8 oz (74.8 kg)   Physical Exam Vitals reviewed.  Constitutional:      Appearance: She is not ill-appearing.  Pulmonary:     Effort: Pulmonary effort is normal.  Abdominal:     General: There is no distension.  Neurological:     Mental Status: She is alert and oriented to person, place, and time.  Psychiatric:        Mood and Affect: Mood normal.  Behavior: Behavior normal.    Assessment and plan- Patient is a 73 y.o. female   Dyspareunia- likely secondary to muscle spasms in setting of  reduced elasticity of post operative scar tissue and hypoestrogen state due to menopause. This is a chronic problem and refractory to previous pelvic floor PT, vaginal dilators, vaginal moisturizers. Symptoms have previously improved with oral HRT which is now discontinued d/t DCIS diagnosis. Today we again reviewed anatomy of pelvic floor including activation of chronic pain cycle due to muscle spasms and muscle memory. She has initiated topical estrogen but discontinued d/t husband's illness. We again reviewed how topical estrogen helps tissue of the vagina. Reviewed to use 1 applicator at nightly x 2 weeks then decrease to 3 nights a week. Again reviewed low systemic absorption of estrogen and weakly ER positive dcis history therefore benefit likely outweighs low risk. Again reviewed use of topical moisturizers such as vitamin e, replens or others. Reviewed that lubricants do not soak into the tissue and are used for dilator exercises or sex. Also discussed that she can use vaginal valium prior to dilator exercises or sex to help relax the muscles of the pelvic floor. May also consider ref to uro-gyn for injection of botulinum toxin to affected muscles if refractory symptoms. I also suspect component of anxiety contributing d/t her long history of ongoing trauma and pain. Could consider systemic anxiolytics in the future.   Disposition:  Rtc in 8-12 weeks for reevaluation   Visit Diagnosis 1. Introital dyspareunia    Patient expressed understanding and was in agreement with this plan. She also understands that She can call clinic at any time with any questions, concerns, or complaints.   Thank you for allowing me to participate in the care of this very pleasant patient.   Consuello Masse, DNP, AGNP-C, AOCNP [facility] [phone]  CC: Dr Smith Robert

## 2024-02-25 ENCOUNTER — Encounter: Payer: Self-pay | Admitting: Nurse Practitioner

## 2024-03-22 ENCOUNTER — Ambulatory Visit
Admission: RE | Admit: 2024-03-22 | Discharge: 2024-03-22 | Disposition: A | Discharge status: Home / Self Care | Payer: Medicare Other | Source: Ambulatory Visit | Attending: Radiation Oncology | Admitting: Radiation Oncology

## 2024-03-22 ENCOUNTER — Encounter: Payer: Self-pay | Admitting: Radiation Oncology

## 2024-03-22 VITALS — BP 145/86 | HR 70 | Temp 98.0°F | Resp 16 | Ht 62.5 in | Wt 166.7 lb

## 2024-03-22 DIAGNOSIS — D0512 Intraductal carcinoma in situ of left breast: Secondary | ICD-10-CM | POA: Insufficient documentation

## 2024-03-22 DIAGNOSIS — Z923 Personal history of irradiation: Secondary | ICD-10-CM | POA: Diagnosis not present

## 2024-03-22 DIAGNOSIS — R197 Diarrhea, unspecified: Secondary | ICD-10-CM | POA: Insufficient documentation

## 2024-03-22 DIAGNOSIS — Z170 Estrogen receptor positive status [ER+]: Secondary | ICD-10-CM | POA: Insufficient documentation

## 2024-03-22 NOTE — Progress Notes (Signed)
 Radiation Oncology Follow up Note  Name: Becky Mitchell   Date:   03/22/2024 MRN:  [id number] DOB: 05/20/1951    This 73 y.o. 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  REFERRING PROVIDER: Rex Castor, MD  HPI: Patient is a 73 year old female now seen out close to 20 months having completed whole breast radiation to her left breast for ER positive ductal carcinoma in situ.  She from a breast standpoint she is doing well specifically [place] breast tenderness cough or bone pain.  She had COVID in the past 6 months which is left her with some GI problems and including significant intermittent diarrhea.  She is not on endocrine therapy based on I believe cardiac issues.  She has a mammogram scheduled for tomorrow which I will review when it becomes available.  COMPLICATIONS OF TREATMENT: none  FOLLOW UP COMPLIANCE: keeps appointments   PHYSICAL EXAM:  BP (!) 145/86   Pulse 70   Temp 98 F (36.7 C)   Resp 16   Ht 5' 2.5" (1.588 m)   Wt 166 lb 11.2 oz (75.6 kg)   BMI 30.00 kg/m  Lungs are clear to A&P cardiac examination essentially unremarkable with regular rate and rhythm. No dominant mass or nodularity is noted in either breast in 2 positions examined. Incision is well-healed. No axillary or supraclavicular adenopathy is appreciated. Cosmetic result is excellent.  Well-developed well-nourished patient in NAD. HEENT reveals PERLA, EOMI, discs not visualized.  Oral cavity is clear. No oral mucosal lesions are identified. Neck is clear without evidence of cervical or supraclavicular adenopathy. Lungs are clear to A&P. Cardiac examination is essentially unremarkable with regular rate and rhythm without murmur rub or thrill. Abdomen is benign with no organomegaly or masses noted. Motor sensory and DTR levels are equal and symmetric in the upper and lower extremities. Cranial  nerves II through XII are grossly intact. Proprioception is intact. No peripheral adenopathy or edema is identified. No motor or sensory levels are noted. Crude visual fields are within normal range.  RADIOLOGY RESULTS: Mammogram to be reviewed tomorrow  PLAN: Present time patient from a breast standpoint is doing well now out closing in on 2 years with no evidence of disease.  I am going to turn follow-up care over to surgery and medical oncology both which she continues to see on a regular basis.  I would be happy to reevaluate the patient anytime should that be indicated.  I would like to take this opportunity to thank you for allowing me to participate in the care of your patient.Glenis Langdon, MD

## 2024-03-24 ENCOUNTER — Ambulatory Visit
Admission: RE | Admit: 2024-03-24 | Discharge: 2024-03-24 | Disposition: A | Discharge status: Home / Self Care | Source: Ambulatory Visit | Attending: General Surgery | Admitting: General Surgery

## 2024-03-24 DIAGNOSIS — Z853 Personal history of malignant neoplasm of breast: Secondary | ICD-10-CM | POA: Diagnosis not present

## 2024-03-24 DIAGNOSIS — R92323 Mammographic fibroglandular density, bilateral breasts: Secondary | ICD-10-CM | POA: Diagnosis not present

## 2024-03-24 DIAGNOSIS — N6099 Unspecified benign mammary dysplasia of unspecified breast: Secondary | ICD-10-CM | POA: Diagnosis not present

## 2024-03-24 DIAGNOSIS — D0510 Intraductal carcinoma in situ of unspecified breast: Secondary | ICD-10-CM | POA: Diagnosis not present

## 2024-03-31 DIAGNOSIS — D0512 Intraductal carcinoma in situ of left breast: Secondary | ICD-10-CM | POA: Diagnosis not present

## 2024-04-17 ENCOUNTER — Ambulatory Visit: Admitting: Nurse Practitioner

## 2024-04-21 ENCOUNTER — Encounter: Payer: Self-pay | Admitting: Nurse Practitioner

## 2024-04-21 ENCOUNTER — Inpatient Hospital Stay: Attending: Nurse Practitioner | Admitting: Nurse Practitioner

## 2024-04-21 VITALS — BP 151/80 | HR 65 | Temp 97.6°F | Resp 16

## 2024-04-21 DIAGNOSIS — E2839 Other primary ovarian failure: Secondary | ICD-10-CM | POA: Diagnosis not present

## 2024-04-21 DIAGNOSIS — D0512 Intraductal carcinoma in situ of left breast: Secondary | ICD-10-CM | POA: Diagnosis not present

## 2024-04-21 DIAGNOSIS — N941 Unspecified dyspareunia: Secondary | ICD-10-CM | POA: Diagnosis not present

## 2024-04-21 DIAGNOSIS — R891 Abnormal level of hormones in specimens from other organs, systems and tissues: Secondary | ICD-10-CM | POA: Diagnosis not present

## 2024-04-21 DIAGNOSIS — N9411 Superficial (introital) dyspareunia: Secondary | ICD-10-CM

## 2024-04-21 MED ORDER — DIAZEPAM 5 MG PO TABS: ORAL_TABLET | ORAL | 0 refills | Status: DC

## 2024-04-21 MED ORDER — ESTRADIOL 0.1 MG/GM VA CREA: 0.2500 | TOPICAL_CREAM | VAGINAL | 2 refills | Status: AC

## 2024-04-21 NOTE — Progress Notes (Signed)
 Symptom Management Clinic  [facility] A Department of the [place]. [facility] [address] [place], [place] [address] [phone] (phone) [phone] (fax)  Patient Care Team: Rex Castor, MD as PCP - General (Internal Medicine) Glenis Langdon, MD as Consulting Physician (Radiation Oncology) Eldred Grego, MD as Consulting Physician (General Surgery) Avonne Boettcher, MD as Consulting Physician (Oncology)   Name of the [name]  [id number]  09-13-1951   Date of visit: 04/21/24  Diagnosis- DCIS  Chief complaint/ Reason for visit- Dyspareunia  Heme/Onc history:  Oncology History  Ductal carcinoma in situ (DCIS) of left breast  08/24/2022 Initial Diagnosis   Ductal carcinoma in situ (DCIS) of left breast   08/24/2022 Cancer Staging   Staging form: Breast, AJCC 8th Edition - Clinical stage from 08/24/2022: Stage 0 (cTis (DCIS), cN0, cM0, G2, ER+, PR: Not Assessed, HER2: Not Assessed) - Signed by Avonne Boettcher, MD on 08/24/2022 Histologic grading system: 3 grade system     Interval history- Lamia Evangeline Poulson is a 73 y.o. female with above history of DCIS, not on AI d/t weakly ER positivity and concern for cardiac risks, who returns to clinic for follow up for dyspareunia. She has run out of topical estrogen and couldn't refill it d/t insurance limitations. She is using coconut oil as moisturizer and lubricant which is working well. Using vaginal valium  but inserting approximately 6 hours prior to sexual activity. She has now been able to have intercourse twice and denies adverse effects. Feels good about her treatment plan.   Review of systems- Review of Systems  Constitutional:  Negative for malaise/fatigue and weight loss.  Gastrointestinal:  Negative for abdominal pain, blood in stool, constipation, diarrhea and melena.  Genitourinary:  Negative for dysuria, flank pain, frequency,  hematuria and urgency.       Denies vaginal bleeding, spotting, discharge.   Psychiatric/Behavioral:  Negative for depression. The patient is not nervous/anxious.      Allergies  Allergen Reactions   Fire Ant Anaphylaxis   Sulfa Antibiotics Shortness Of Breath and Rash   Bromfenac Other (See Comments)    MADE EYELIDS RED & EYES BLOODSHOT   Loteprednol Etabonate Other (See Comments)    MADE EYELIDS RED & EYES BLOODSHOT    Moxifloxacin Other (See Comments)    EYE DROPS---MADE EYELIDS RED & EYES BLOODSHOT    Latex Dermatitis   Tape Itching    Past Medical History:  Diagnosis Date   Allergies    Anginal pain (HCC)    Arthritis    Asthma    Breast calcification, left    a.) Bx 05/20/2022 --> at least ADH, however features concerning for DCIS   Cataracts, bilateral    a.) s/p extraction 2020   Complication of anesthesia    a.) delayed emergence following routine colonoscopy   Coronary artery disease 04/20/2017   a.) LHC/PCI [facility] 04/20/2017 --> 85% pRCA, 85% pOM --> 2.5 x 12 mm Promus Primere DES to pRCA and 3.0 x 20 mm Promus Primere DES to pLCx; b.) LHC 11/06/2021: EF 55-65%, 25% p-mLM, 40% o-pLAD, 30-50% p-mLAD --> med mgmt.   Dyspareunia in female    Family history of adverse reaction to anesthesia    a.) delayed emergence in first degree relative (mother)   Fatty liver    Heart murmur    Hyperlipidemia    Hypertension     Past Surgical History:  Procedure Laterality Date  ABDOMINAL HYSTERECTOMY  1975   BREAST BIOPSY Left 04/27/2022   stereo bx/ x clip/ extremely scant and scattered detached groups of ductal epithelial cells in a background of adipose tissue   BREAST BIOPSY Left 05/20/2022   stereo bx, calcs, "COIL" clip-atypical ductal hyperplasia with associated calcifications.  Background mammary parenchyma with prior biopsy site change, fat necrosis, and focal lobular neoplasia.  Negative for invasive carcinoma.  Concerning for DCIS.   BREAST BIOPSY  Left 06/12/2022   Procedure: BREAST BIOPSY WITH NEEDLE LOCALIZATION;  Surgeon: Marshall Skeeter, MD;  Location: [facility] ORS;  Service: General;  Laterality: Left;   BREAST LUMPECTOMY Left 2001   CATARACT EXTRACTION Bilateral 2020   CORONARY ANGIOPLASTY WITH STENT PLACEMENT Left 04/20/2017   Procedure: CORONARY ANGIOPLASTY WITH STENT PLACEMENT; Location: [facility]; Surgeon: Darnella Elder, MD   FOOT SURGERY Left    LEFT HEART CATH AND CORONARY ANGIOGRAPHY N/A 11/06/2021   Procedure: LEFT HEART CATH AND CORONARY ANGIOGRAPHY;  Surgeon: Antonette Batters, MD;  Location: [facility] INVASIVE CV LAB;  Service: Cardiovascular;  Laterality: N/A;   spine cyst aspiration  10/28/2017    Social History   Socioeconomic History   Marital status: Married    Spouse name: Hewitt Lou   Number of children: 1   Years of education: Not on file   Highest education level: Not on file  Occupational History   Not on file  Tobacco Use   Smoking status: Never    Passive exposure: Past (ex spouse smoked in home 28 years)   Smokeless tobacco: Never  Vaping Use   Vaping status: Never Used  Substance and Sexual Activity   Alcohol use: Yes    Alcohol/week: 5.0 standard drinks of alcohol    Types: 5 Glasses of wine per week    Comment: wine-3-4 times a week   Drug use: Never   Sexual activity: Not on file  Other Topics Concern   Not on file  Social History Narrative   Lives at home with spouse Hewitt Lou    Social Drivers of Health   Financial Resource Strain: Low Risk  (02/03/2024)   Received [facility]   Overall Financial Resource Strain (CARDIA)    Difficulty of Paying Living Expenses: Not very hard  Food Insecurity: No Food Insecurity (02/03/2024)   Received from [facility]   Hunger Vital Sign    Worried About Running Out of Food in the Last Year: Never true    Ran Out of Food in the Last Year: Never true  Transportation Needs: No Transportation Needs (02/03/2024)    Received from [facility] - Transportation    In the past 12 months, has lack of transportation kept you from medical appointments or from getting medications?: No    Lack of Transportation (Non-Medical): No  Physical Activity: Not on file  Stress: Not on file  Social Connections: Unknown (01/13/2023)   Received from [facility]   Social Network    Social Network: Not on file  Intimate Partner Violence: Unknown (01/13/2023)   Received from [facility], [facility]   HITS    Physically Hurt: Not on file    Insult or Talk Down To: Not on file    Threaten Physical Harm: Not on file    Scream or Curse: Not on file    Family History  Problem Relation Age of Onset   Hypertension Mother    Skin cancer Mother  Kidney disease Mother    Gout Mother    Hypertension Father    Heart failure Father    Diabetes Sister    Fibromyalgia Sister    Arrhythmia Sister    Hypertension Brother    Prostate cancer Brother        dx 71s   Lung cancer Brother    Pancreatic cancer Paternal Aunt    Lung cancer Paternal Uncle    Brain cancer Maternal Grandmother 30   Breast cancer Cousin        dx 64s   Breast cancer Other     Current Outpatient Medications:    Ascorbic Acid (VITAMIN C) 1000 MG tablet, Take 1,000 mg by mouth daily., Disp: , Rfl:    aspirin  EC 81 MG tablet, Take 81 mg by mouth every evening. Swallow whole., Disp: , Rfl:    Cholecalciferol (VITAMIN D3 PO), Take 1 tablet by mouth in the morning., Disp: , Rfl:    Coenzyme Q10-Vitamin E (COQ10 ST-100 PO), Take 100 mg by mouth every evening., Disp: , Rfl:    diazepam  (VALIUM ) 5 MG tablet, Apply few drops of water  to tablet then insert high into vagina approximately 45 minutes prior to dilator exercise or intercourse., Disp: 30 tablet, Rfl: 0   EPINEPHrine  0.3 mg/0.3 mL IJ SOAJ injection, Inject 0.3 mg into the muscle as needed for anaphylaxis., Disp: , Rfl:    estradiol  (ESTRACE ) 0.1  MG/GM vaginal cream, Place vaginally., Disp: , Rfl:    ezetimibe (ZETIA) 10 MG tablet, Take 10 mg by mouth in the morning., Disp: , Rfl:    losartan (COZAAR) 50 MG tablet, Take 50 mg by mouth in the morning., Disp: , Rfl:    metoprolol succinate (TOPROL-XL) 50 MG 24 hr tablet, Take 50 mg by mouth every evening. Take with or immediately following a meal., Disp: , Rfl:    potassium chloride (KLOR-CON M) 10 MEQ tablet, Take by mouth., Disp: , Rfl:    rosuvastatin (CRESTOR) 40 MG tablet, Take 40 mg by mouth at bedtime., Disp: , Rfl:    stavudine (ZERIT) 20 MG capsule, Take 20 mg by mouth daily., Disp: , Rfl:    albuterol (VENTOLIN HFA) 108 (90 Base) MCG/ACT inhaler, Inhale 1-2 puffs into the lungs every 6 (six) hours as needed for wheezing or shortness of breath (HAS NOT USED SINCE 2021). (Patient not taking: Reported on 09/21/2022), Disp: , Rfl:    Calcium Carbonate (CALCIUM 600 PO), Take 1 tablet by mouth in the morning. (Patient not taking: Reported on 04/21/2024), Disp: , Rfl:    coconut oil OIL, Apply 1 application topically every other day. AT NIGHT (Applied to feet) (Patient not taking: Reported on 04/21/2024), Disp: , Rfl:    potassium citrate (UROCIT-K) 10 MEQ (1080 MG) SR tablet, Take 10 mEq by mouth in the morning. (Patient not taking: Reported on 02/15/2024), Disp: , Rfl:   Physical exam:  Vitals:   04/21/24 1104  BP: (!) 151/80  Pulse: 65  Resp: 16  Temp: 97.6 F (36.4 C)  TempSrc: Tympanic  SpO2: 100%   Physical Exam Vitals reviewed.  Constitutional:      Appearance: She is not ill-appearing.  Pulmonary:     Effort: Pulmonary effort is normal.  Abdominal:     General: There is no distension.  Neurological:     Mental Status: She is alert and oriented to person, place, and time.  Psychiatric:        Mood and Affect: Mood normal.  Behavior: Behavior normal.     Assessment and plan- Patient is a 73 y.o. 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 Chronic. Refractory to pelvic floor PT, dilators, moisturizers. Improved with oral HRT but now discontinued/contraindicated d/t DCIS diagnosis. Previously discussed that low systemic absorption of estrogen and weakly ER positive dcis history likely is low risk. She elected to proceed with topical estrogen. She is using topical estrogen with improvement but had to discontinue d/t running out of cream/insurance limitation. Refilled. Plan for 0.25 applicatorful 3 times a week. Refilled. She is using vaginal valium  5 mg prior to dilator and/sexual activity with good results. She's now been able to have intercourse twice. Feels that she is having good results to treatment plan. Plan to continue. Could also consider ref to uro-gyn for injection of botulinum toxin to affected muscles if refractory symptoms. She will continue vaginal moisturizers and lubricants as well- preferring coconut oil which is working well. I also suspect component of anxiety contributing d/t her long history of ongoing trauma and pain. Could consider systemic anxiolytics in the future.   Disposition:  Rtc in 6 mo for follow up- la   Visit Diagnosis 1. Introital dyspareunia   2. Hypoestrogenism    Patient expressed understanding and was in agreement with this plan. She also understands that She can call clinic at any time with any questions, concerns, or complaints.   Thank you for allowing me to participate in the care of this very pleasant patient.   Kenney Peacemaker, DNP, AGNP-C, AOCNP [facility] [phone]  CC: Dr Randy Buttery

## 2024-06-05 DIAGNOSIS — I25119 Atherosclerotic heart disease of native coronary artery with unspecified angina pectoris: Secondary | ICD-10-CM | POA: Diagnosis not present

## 2024-06-05 DIAGNOSIS — I10 Essential (primary) hypertension: Secondary | ICD-10-CM | POA: Diagnosis not present

## 2024-06-05 DIAGNOSIS — R0789 Other chest pain: Secondary | ICD-10-CM | POA: Diagnosis not present

## 2024-06-05 DIAGNOSIS — C50012 Malignant neoplasm of nipple and areola, left female breast: Secondary | ICD-10-CM | POA: Diagnosis not present

## 2024-06-05 DIAGNOSIS — E782 Mixed hyperlipidemia: Secondary | ICD-10-CM | POA: Diagnosis not present

## 2024-06-12 DIAGNOSIS — E782 Mixed hyperlipidemia: Secondary | ICD-10-CM | POA: Diagnosis not present

## 2024-06-12 DIAGNOSIS — I25118 Atherosclerotic heart disease of native coronary artery with other forms of angina pectoris: Secondary | ICD-10-CM | POA: Diagnosis not present

## 2024-06-12 DIAGNOSIS — Z955 Presence of coronary angioplasty implant and graft: Secondary | ICD-10-CM | POA: Diagnosis not present

## 2024-06-12 DIAGNOSIS — I10 Essential (primary) hypertension: Secondary | ICD-10-CM | POA: Diagnosis not present

## 2024-06-12 DIAGNOSIS — R0609 Other forms of dyspnea: Secondary | ICD-10-CM | POA: Diagnosis not present

## 2024-06-12 NOTE — Progress Notes (Addendum)
 Established Patient Visit   Chief Complaint: Chief Complaint  Patient presents with  . Chest Pain    With exertion in the heat; patient stated when bending over; and when she have sex sometimes   . Shortness of Breath    With exertion   . Hypertension    Occas    Date of Service: 06/12/2024 Date of Birth: 1951/09/05 PCP: Sherial Bail, MD [address] [place] [address]  History of Present Illness:   Becky Mitchell is a 73 y.o.female patient that presents for 1 year f/u.   PMH: CAD s/p coronary stent 04/2017 at outside facility HLD HTN Chronic angina Chronic dyspnea  Last seen by Dr. Florencio 06/08/2023.    Since last visit, Becky Mitchell reports concerns today: some worsening of her chronic DOE. She reports 10 lbs weight gain in last year, 30 lbs in the last 3 years. Reports she takes care of her mother and mother-in-law, both 60 years old. Her husband was diagnosed with dementia in the last year, and she is also managing his care. She drinks a glass of wine nightly. She is not exercising. Walked 10 miles in 2 days on recent trip to [place], [place]. Had to stop frequently to take breaks, about every 15 minutes due to chest tightness and DOE. Both dyspnea and chest tightness resolved with rest. She additionally reports DOE with yard work, lifting heavy objects, vacuuming. She additionally reports left sided chest pain and dyspnea with sexual intercourse. She reports chest pain and DOE with minimal exertion are new in the last year. Denies leg edema, palpitations, orthopnea, or dizziness. EKG today reveals NSR with new T wave inversion in V2, otherwise no changes from prior EKG.   LHC 11/06/2021   .  Ost LM to Mid LM lesion is 25% stenosed. SABRA Pais LAD to Prox LAD lesion is 40% stenosed. .  Previously placed Prox RCA to Mid RCA stent (unknown type) is  widely patent. .  Previously placed Mid Cx to Dist Cx stent (unknown type) is  widely patent. .  The left  ventricular systolic function is normal. .  LV end diastolic pressure is normal. .  The left ventricular ejection fraction is 55-65% by visual estimate. .  Widely patent mid circumflex stent .  Widely patent mid RCA stent .  LAD had moderate to severe calcification proximal and mid with 30 to 50% diffuse lesion nonobstructive  Conclusion Left ventriculogram showed normal left ventricular function of at least 55% Coronaries Left main large moderate to severe calcification no obstructive disease LAD large moderate to severe calcification proximal and mid no significant obstructive disease 30 to 50% lesion proximal to mid Circumflex large widely patent mid circumflex stent no significant obstructive disease RCA medium to large with a widely patent mid RCA stent no significant obstructive disease All vessels had TIMI-3 flow Intervention was deferred Patient tolerated procedure well No complications   2D ECHO 10/26/2022 NORMAL LEFT VENTRICULAR SYSTOLIC FUNCTION WITH AN ESTIMATED EF = >55 % NORMAL RIGHT VENTRICULAR SYSTOLIC FUNCTION MILD-TO-MODERATE TRICUSPID AND MITRAL VALVE INSUFFICIENCY TRACE AORTIC VALVE INSUFFICIENCY NO VALVULAR STENOSIS GLS: -16.0 %  Past Medical and Surgical History  Past Medical History Past Medical History:  Diagnosis Date  . Allergy 1970  . Arthritis 2020  . Asthma, unspecified asthma severity, unspecified whether complicated, unspecified whether persistent (HHS-HCC) 1998  . Breast cancer of upper-outer quadrant of left female breast (CMS/HHS-HCC) 06/12/2022   Intermediate grade DCIS, 0.7 mm posterior margin, ER 11-50%.Genetic testing  reported out on 07/21/2022 through the [facility]Expanded[facility]RNA cancer panel found no pathogenic mutations.  . Heart murmur 2022  . History of cataract 2020   corrected  . Hyperlipidemia 2018   under control  . Hypertension 1976  . Personal history of radiation therapy 2023    Past Surgical History She has a  past surgical history that includes Appendectomy; Cataract extraction (2020); Hysterectomy (1975); Breast excisional biopsy (Left, 05/20/2022); Breast excisional biopsy (Left, 04/27/2022); coronary angiography (Left, 11/06/2021); spinal cyst aspiration (10/28/2017); cardiac catheterization (04/20/2017); breast biopsy with wire loc (Left, 06/12/2022); and Cardiac catheterization (2021).   Medications and Allergies  Current Medications  Current Outpatient Medications on File Prior to Visit  Medication Sig Dispense Refill  . albuterol 90 mcg/actuation inhaler INHALE 2 PUFFS BY MOUTH EVERY 6 HOURS AS NEEDED FOR WHEEZE 18 g 3  . aspirin  81 MG EC tablet Take by mouth    . buPROPion (WELLBUTRIN XL) 150 MG XL tablet Take 1 tablet (150 mg total) by mouth once daily Take the medication in the morning after food 30 tablet 0  . cholecalciferol 1000 unit tablet Take 1,000 Units by mouth once daily    . coconut oil, bulk, Oil Apply 1 Application topically at bedtime    . coenzyme Q10 (CO Q-10) 10 mg capsule Take 10 mg by mouth once daily    . cyanocobalamin  (VITAMIN B12) 1000 MCG tablet Take 1,000 mcg by mouth once daily    . cyclobenzaprine (FLEXERIL) 10 MG tablet Take 1 tablet (10 mg total) by mouth 2 (two) times daily as needed for Muscle spasms (muscle pain) for up to 10 days 20 tablet 0  . diazePAM  (VALIUM ) 5 MG tablet Apply few drops of water  to tablet then insert high into vagina approximately 45 minutes prior to dilator exercise or intercourse.    . EPINEPHrine  (EPIPEN ) 0.3 mg/0.3 mL auto-injector INJECT 0.3 ML (0.3 MG TOTAL) INTO THE MUSCLE ONCE AS NEEDED FOR ANAPHYLAXIS FOR UP TO 1 DOSE    . estradioL  (ESTRACE ) 0.01 % (0.1 mg/gram) vaginal cream Place 0.25 Applicatorfuls vaginally once a week    . ezetimibe (ZETIA) 10 mg tablet Take 1 tablet (10 mg total) by mouth once daily 90 tablet 3  . KLOR-CON M10 10 mEq ER tablet TAKE 1 TABLET BY MOUTH ONCE DAILY 90 tablet 1  . losartan (COZAAR) 50 MG tablet  Take 1.5 tablets (75 mg total) by mouth once daily 135 tablet 1  . MAGNESIUM CITRATE ORAL Take 250 mg by mouth once daily    . metoprolol SUCCinate (TOPROL-XL) 50 MG XL tablet TAKE 1 TABLET BY MOUTH EVERY DAY 90 tablet 3  . nitroGLYcerin (NITROSTAT) 0.4 MG SL tablet PLACE 1 TABLET (0.4 MG TOTAL) UNDER THE TONGUE EVERY 5 (FIVE) MINUTES AS NEEDED FOR CHEST PAIN MAY TAKE UP TO 3 DOSES. 75 tablet 1  . rosuvastatin (CRESTOR) 40 MG tablet TAKE 1 TABLET BY MOUTH EVERY DAY 90 tablet 1   No current facility-administered medications on file prior to visit.    Allergies: Bromfenac, Loteprednol etabonate, Moxifloxacin, Sulfa (sulfonamide antibiotics), Fire ant, Adhesive tape-silicones, and Latex  Social and Family History  Social History  reports that she has never smoked. She has never used smokeless tobacco. She reports current alcohol use of about 3.0 standard drinks of alcohol per week. She reports that she does not use drugs.  Family History Family History  Problem Relation Name Age of Onset  . High blood pressure (Hypertension) Mother Antonisha Waskey   .  Stroke Mother Zakaria Fromer   . Thyroid  disease Mother Estephany Perot   . Diabetes type II Father Alexa Cork        deceased  . High blood pressure (Hypertension) Father Alexa Cork        Deceased  . Colon polyps Sister Loa Solomons        also myself  . Coronary Artery Disease (Blocked arteries around heart) Sister Diane Arnold        and myself  . Diabetes type II Sister Diane Arnold   . High blood pressure (Hypertension) Sister Diane Arnold   . Skin cancer Sister Loa Solomons   . High blood pressure (Hypertension) Sister Lilyan Hint   . Diabetes type II Brother Carlin Cork   . Hyperlipidemia (Elevated cholesterol) Brother Carlin Cork   . High blood pressure (Hypertension) Brother Carlin Cork   . Prostate cancer Brother Carlin Cork   . Cancer Brother Carlin Cork        lung and prostrate  . Bipolar disorder Daughter  Rosaline Plowman   . Breast cancer Maternal Aunt    . Colon cancer Paternal Aunt      Review of Systems   Review of Systems  Constitutional:        Weight gain  Respiratory:  Positive for shortness of breath.   Cardiovascular:  Positive for chest pain. Negative for palpitations and leg swelling.  Neurological:  Negative for dizziness.  Endo/Heme/Allergies:  Does not bruise/bleed easily.      Physical Examination   Vitals:BP 110/80 (BP Location: Left upper arm, Patient Position: Sitting, BP Cuff Size: Adult)   Pulse 74   Resp 14   Ht 157.5 cm (5' 2)   Wt 76.7 kg (169 lb 3.2 oz)   SpO2 94%   BMI 30.95 kg/m  Ht:157.5 cm (5' 2) Wt:76.7 kg (169 lb 3.2 oz) ADJ:Anib surface area is 1.83 meters squared. Body mass index is 30.95 kg/m.  Physical Exam Vitals reviewed.  Constitutional:      General: She is not in acute distress.    Appearance: Normal appearance.  Cardiovascular:     Rate and Rhythm: Normal rate and regular rhythm.     Heart sounds: Normal heart sounds. No murmur heard. Pulmonary:     Effort: Pulmonary effort is normal. No respiratory distress.     Breath sounds: Normal breath sounds.  Musculoskeletal:     Right lower leg: No edema.     Left lower leg: No edema.  Neurological:     Mental Status: She is alert.       Data & Results   Recent Labs    12/18/21 0901 01/27/23 1050 01/27/24 0950  CHOLTOTAL 141 137 127  HDL 62.3 58.7 51.1  LDLCALC 56 55 56  VLDL 22 24 20   TRIG 112 118 99    Recent Labs    10/16/21 1108 01/27/23 1050 01/27/24 0950  NA 142 145 143  K 5.1 4.3 4.6  BUN 16 18 14   CREATININE 0.9 0.9 0.8  CO2 32.3* 26.5 27.4  GLUCOSE 90 94 108  ALT 21 20 25   AST 30 27 35  TBILI 0.7 0.8 0.5  ALB 4.4 4.5 4.1    Recent Labs    07/27/22 0938 01/27/23 1050 01/27/24 0950  WBC 5.2 4.0* 4.7  HGB 15.0 14.8 13.8  HCT 43.9 43.4 41.2  MCV 91.3 92.5 94.7  PLT 166 122* 166    Recent Labs    07/27/22 0938 01/27/23 1050  01/27/24 0950  TSH 2.455 2.364 2.703  HGBA1C 6.5* 6.0* 6.2*        Assessment   73 y.o. female with  Encounter Diagnoses  Name Primary?  . Essential hypertension Yes  . Coronary artery disease of native artery of native heart with stable angina pectoris ()   . Hyperlipidemia, mixed   . History of heart artery stent   . DOE (dyspnea on exertion)     Plan   Orders Placed This Encounter  Procedures  . NM myocardial perfusion SPECT multiple (stress and rest)  . ECG 12-lead  . Echo complete   - Testing as above with new chest pain and DOE  -  ED with any worsening or unresolving chest pain - continue current medications. No changes today. Patient on aspirin , zetia, losartan, metoprolol XL, and rosuvastatin for ASCVD. Last LDL at goal of < 70 mg/dL.  Return in about 2 weeks (around 06/26/2024).   Attestation Statement:   I personally performed the service, non-incident to. [facility])   MARY TINNIE LAUNIE MAIDEN, NP

## 2024-06-20 DIAGNOSIS — Z955 Presence of coronary angioplasty implant and graft: Secondary | ICD-10-CM | POA: Diagnosis not present

## 2024-06-20 DIAGNOSIS — I25118 Atherosclerotic heart disease of native coronary artery with other forms of angina pectoris: Secondary | ICD-10-CM | POA: Diagnosis not present

## 2024-06-20 DIAGNOSIS — R0609 Other forms of dyspnea: Secondary | ICD-10-CM | POA: Diagnosis not present

## 2024-07-03 ENCOUNTER — Encounter (HOSPITAL_COMMUNITY): Payer: Self-pay

## 2024-07-03 ENCOUNTER — Other Ambulatory Visit: Payer: Self-pay | Admitting: Nurse Practitioner

## 2024-07-03 DIAGNOSIS — I25118 Atherosclerotic heart disease of native coronary artery with other forms of angina pectoris: Secondary | ICD-10-CM | POA: Diagnosis not present

## 2024-07-03 DIAGNOSIS — E782 Mixed hyperlipidemia: Secondary | ICD-10-CM | POA: Diagnosis not present

## 2024-07-03 DIAGNOSIS — Z955 Presence of coronary angioplasty implant and graft: Secondary | ICD-10-CM | POA: Diagnosis not present

## 2024-07-03 DIAGNOSIS — I10 Essential (primary) hypertension: Secondary | ICD-10-CM | POA: Diagnosis not present

## 2024-07-03 NOTE — Progress Notes (Signed)
 Established Patient Visit   Chief Complaint: Chief Complaint  Patient presents with  . Hypertension  . Follow-up  . Results   Date of Service: 07/03/2024 Date of Birth: [birth year] PCP: Sherial Bail, MD [address] [place] [address]  History of Present Illness:   Ms. Buchanan is a 73 y.o.female patient that presents for 1 month f/u.   PMH: CAD s/p coronary stent to RCA and LCX 04/2017 at outside facility HLD HTN Chronic angina Chronic dyspnea  Last seen by Dr. Florencio 06/08/2023.    Seen 06/12/24 reporting some worsening of her chronic DOE and exertional chest pain. She reports 10 lbs weight gain in last year, 30 lbs in the last 3 years. Reports she takes care of her mother and mother-in-law, both 11 years old. Her husband was diagnosed with dementia in the last year, and she is also managing his care. She drinks a glass of wine nightly. She is not exercising. She previously walked her dog 10-12 minutes three times daily for exercise [place] No acute weight gain of 2 lbs overnight or 5 lbs in a week. She has some occasional swelling in her feet when wearing sandals but none consistently. No BLE edema. Walked 10 miles in 2 days on recent trip to [place], [place]. Had to stop frequently to take breaks, about every 15 minutes due to chest tightness and DOE. Both dyspnea and chest tightness resolved with rest. She additionally reports DOE with yard work, lifting heavy objects, vacuuming. She additionally reports left sided chest pain and dyspnea with sexual intercourse within the last 3 months. She had not had sexual intercourse for years until a few months ago and noted s/s as above. She reports having these symptoms previously prior to her coronary stents placement. She did not have intercourse following stent placement in 2018 until resuming 3 months ago, so she is unsure if her symptoms are chronic or new. She reports chest pain and  DOE with minimal exertion are chronic but worsening in the last year. Denies leg edema, palpitations, orthopnea, or dizziness. EKG at last visit revealed NSR with new T wave inversion in V2, otherwise no changes from prior EKG. EKG today reveals NSR, HR 66 bpm, no acute ST-T wave changes. She had a prior [facility] 10/2021 that revealed widely patent RCA and LCX stents with non-obstructive stenosis in LM (25% lesion) and moderate to severe calcification of LAD (40% lesion) without obstructive stenosis. She had a prior 2D ECHO 10/26/2022 that revealed normal biventricular systolic function, EF > 55%, mild-moderate TR and MR, no valvular stenosis.SABRA  ETT Myoview and ECHO ordered.    Pt presents for follow up today to discuss results. She reports symptoms are stable as above without any new or worsening s/s. ETT Myoview 06/20/24 showed suboptimal stress with 81% max HR achieved after walking 5:00 minutes on treadmill per Bruce protocol. METS 7.00 achieved. Chest pain noted with recovery. No evidence of stress-induced myocardial ischemia, LVEF 70%. 2D ECHO 06/20/24 revealed no LVH, normal biventricular systolic function, EF > 55%, mild MR, moderate TR, no valvular stenosis, normal RVSP 30 mmHg.   Past Medical and Surgical History  Past Medical History Past Medical History:  Diagnosis Date  . Allergy 1970  . Arthritis 2020  . Asthma, unspecified asthma severity, unspecified whether complicated, unspecified whether persistent (HHS-HCC) 1998  . Breast cancer of upper-outer quadrant of left female breast (CMS/HHS-HCC) 06/12/2022   Intermediate grade DCIS, 0.7 mm posterior  margin, ER 11-50%.Genetic testing reported out on 07/21/2022 through the [facility]Expanded+RNA cancer panel found no pathogenic mutations.  . Heart murmur 2022  . History of cataract 2020   corrected  . Hyperlipidemia 2018   under control  . Hypertension 1976  . Personal history of radiation therapy 2023    Past Surgical History She has a  past surgical history that includes Appendectomy; Cataract extraction (2020); Hysterectomy (1975); Breast excisional biopsy (Left, 05/20/2022); Breast excisional biopsy (Left, 04/27/2022); coronary angiography (Left, 11/06/2021); spinal cyst aspiration (10/28/2017); cardiac catheterization (04/20/2017); breast biopsy with wire loc (Left, 06/12/2022); and Cardiac catheterization (2021).   Medications and Allergies  Current Medications  Current Outpatient Medications on File Prior to Visit  Medication Sig Dispense Refill  . albuterol 90 mcg/actuation inhaler INHALE 2 PUFFS BY MOUTH EVERY 6 HOURS AS NEEDED FOR WHEEZE 18 g 3  . aspirin  81 MG EC tablet Take by mouth    . buPROPion (WELLBUTRIN XL) 150 MG XL tablet TAKE 1 TABLET (150 MG TOTAL) BY MOUTH ONCE DAILY TAKE THE MEDICATION IN THE MORNING AFTER FOOD 90 tablet 1  . cholecalciferol 1000 unit tablet Take 1,000 Units by mouth once daily    . coconut oil, bulk, Oil Apply 1 Application topically at bedtime    . coenzyme Q10 (CO Q-10) 10 mg capsule Take 10 mg by mouth once daily    . cyanocobalamin  (VITAMIN B12) 1000 MCG tablet Take 1,000 mcg by mouth once daily    . diazePAM  (VALIUM ) 5 MG tablet Apply few drops of water  to tablet then insert high into vagina approximately 45 minutes prior to dilator exercise or intercourse.    . EPINEPHrine  (EPIPEN ) 0.3 mg/0.3 mL auto-injector INJECT 0.3 ML (0.3 MG TOTAL) INTO THE MUSCLE ONCE AS NEEDED FOR ANAPHYLAXIS FOR UP TO 1 DOSE    . estradioL  (ESTRACE ) 0.01 % (0.1 mg/gram) vaginal cream Place 0.25 Applicatorfuls vaginally once a week    . ezetimibe (ZETIA) 10 mg tablet Take 1 tablet (10 mg total) by mouth once daily 90 tablet 3  . KLOR-CON M10 10 mEq ER tablet TAKE 1 TABLET BY MOUTH ONCE DAILY 90 tablet 1  . losartan (COZAAR) 50 MG tablet Take 1.5 tablets (75 mg total) by mouth once daily 135 tablet 1  . MAGNESIUM CITRATE ORAL Take 250 mg by mouth once daily    . metoprolol SUCCinate (TOPROL-XL) 50 MG XL  tablet TAKE 1 TABLET BY MOUTH EVERY DAY 90 tablet 3  . nitroGLYcerin (NITROSTAT) 0.4 MG SL tablet PLACE 1 TABLET (0.4 MG TOTAL) UNDER THE TONGUE EVERY 5 (FIVE) MINUTES AS NEEDED FOR CHEST PAIN MAY TAKE UP TO 3 DOSES. 75 tablet 1  . rosuvastatin (CRESTOR) 40 MG tablet TAKE 1 TABLET BY MOUTH EVERY DAY 90 tablet 1   No current facility-administered medications on file prior to visit.    Allergies: Bromfenac, Loteprednol etabonate, Moxifloxacin, Sulfa (sulfonamide antibiotics), Fire ant, Adhesive tape-silicones, and Latex  Social and Family History  Social History  reports that she has never smoked. She has never used smokeless tobacco. She reports current alcohol use of about 3.0 standard drinks of alcohol per week. She reports that she does not use drugs.  Family History Family History  Problem Relation Name Age of Onset  . High blood pressure (Hypertension) Mother Jaely Silman   . Stroke Mother Kaylan Yates   . Thyroid  disease Mother Jasminne Mealy   . Diabetes type II Father Alexa Cork        deceased  .  High blood pressure (Hypertension) Father Alexa Cork        Deceased  . Colon polyps Sister Loa Solomons        also myself  . Coronary Artery Disease (Blocked arteries around heart) Sister Diane Arnold        and myself  . Diabetes type II Sister Diane Arnold   . High blood pressure (Hypertension) Sister Diane Arnold   . Skin cancer Sister Loa Solomons   . High blood pressure (Hypertension) Sister Lilyan Hint   . Diabetes type II Brother Carlin Cork   . Hyperlipidemia (Elevated cholesterol) Brother Carlin Cork   . High blood pressure (Hypertension) Brother Carlin Cork   . Prostate cancer Brother Carlin Cork   . Cancer Brother Carlin Cork        lung and prostrate  . Bipolar disorder Daughter Rosaline Plowman   . Breast cancer Maternal Aunt    . Colon cancer Paternal Aunt      Review of Systems   Review of Systems  Constitutional:        Weight gain   Respiratory:  Positive for shortness of breath.   Cardiovascular:  Positive for chest pain. Negative for palpitations and leg swelling.  Neurological:  Negative for dizziness.  Endo/Heme/Allergies:  Does not bruise/bleed easily.      Physical Examination   Vitals:BP 120/82 (BP Location: Left upper arm, Patient Position: Sitting, BP Cuff Size: Small Adult)   Pulse 67   Ht 157.5 cm (5' 2)   Wt 74.8 kg (165 lb)   SpO2 98%   BMI 30.18 kg/m  Ht:157.5 cm (5' 2) Wt:74.8 kg (165 lb) ADJ:Anib surface area is 1.81 meters squared. Body mass index is 30.18 kg/m.  Physical Exam Vitals reviewed.  Constitutional:      General: She is not in acute distress.    Appearance: Normal appearance.  Cardiovascular:     Rate and Rhythm: Normal rate and regular rhythm.     Heart sounds: Normal heart sounds. No murmur heard. Pulmonary:     Effort: Pulmonary effort is normal. No respiratory distress.     Breath sounds: Normal breath sounds.  Musculoskeletal:     Right lower leg: No edema.     Left lower leg: No edema.  Neurological:     Mental Status: She is alert.       Data & Results   Recent Labs    12/18/21 0901 01/27/23 1050 01/27/24 0950  CHOLTOTAL 141 137 127  HDL 62.3 58.7 51.1  LDLCALC 56 55 56  VLDL 22 24 20   TRIG 112 118 99    Recent Labs    10/16/21 1108 01/27/23 1050 01/27/24 0950  NA 142 145 143  K 5.1 4.3 4.6  BUN 16 18 14   CREATININE 0.9 0.9 0.8  CO2 32.3* 26.5 27.4  GLUCOSE 90 94 108  ALT 21 20 25   AST 30 27 35  TBILI 0.7 0.8 0.5  ALB 4.4 4.5 4.1    Recent Labs    07/27/22 0938 01/27/23 1050 01/27/24 0950  WBC 5.2 4.0* 4.7  HGB 15.0 14.8 13.8  HCT 43.9 43.4 41.2  MCV 91.3 92.5 94.7  PLT 166 122* 166    Recent Labs    07/27/22 0938 01/27/23 1050 01/27/24 0950  TSH 2.455 2.364 2.703  HGBA1C 6.5* 6.0* 6.2*     LHC 11/06/2021   .  Ost LM to Mid LM lesion is 25% stenosed. SABRA Pais LAD to Prox LAD  lesion is 40% stenosed. .  Previously  placed Prox RCA to Mid RCA stent (unknown type) is  widely patent. .  Previously placed Mid Cx to Dist Cx stent (unknown type) is  widely patent. .  The left ventricular systolic function is normal. .  LV end diastolic pressure is normal. .  The left ventricular ejection fraction is 55-65% by visual estimate. .  Widely patent mid circumflex stent .  Widely patent mid RCA stent .  LAD had moderate to severe calcification proximal and mid with 30 to 50% diffuse lesion nonobstructive  Conclusion Left ventriculogram showed normal left ventricular function of at least 55% Coronaries Left main large moderate to severe calcification no obstructive disease LAD large moderate to severe calcification proximal and mid no significant obstructive disease 30 to 50% lesion proximal to mid Circumflex large widely patent mid circumflex stent no significant obstructive disease RCA medium to large with a widely patent mid RCA stent no significant obstructive disease All vessels had TIMI-3 flow Intervention was deferred Patient tolerated procedure well No complications   Assessment   73 y.o. female with  Encounter Diagnoses  Name Primary?  . Coronary artery disease of native artery of native heart with stable angina pectoris () Yes  . Hyperlipidemia, mixed   . Essential hypertension   . History of heart artery stent    Plan   Orders Placed This Encounter  Procedures  . PET myocardial perfusion (stress and rest) with concurrent CT Rubidium Rb 82 - [id number]  . ECG 12-lead   - Proceed with PET CT stress test with anginal s/s. Normal ETT Myoview, however criteria for stress not met with 81% of max HR achieved and pt exhibiting chest pain during recovery period. Proceed with LHC with any worsening s/s. ED precautions discussed for any acutely worsening s/s.  - Start Imdur 30 mg ER daily.  - Continue aspirin , zetia, losartan, metoprolol XL, and rosuvastatin for ASCVD. Last LDL at goal of < 70  mg/dL.  Return in about 2 weeks (around 07/17/2024).   Attestation Statement:   I personally performed the service, non-incident to. [facility])   MARY TINNIE LAUNIE MAIDEN, NP

## 2024-07-04 ENCOUNTER — Other Ambulatory Visit: Payer: Self-pay | Admitting: Cardiology

## 2024-07-04 DIAGNOSIS — I25119 Atherosclerotic heart disease of native coronary artery with unspecified angina pectoris: Secondary | ICD-10-CM

## 2024-07-04 NOTE — Progress Notes (Signed)
 Orders only for Cardiac PET stress test.   Danita Bloch, PA-C

## 2024-07-06 ENCOUNTER — Ambulatory Visit
Admission: RE | Admit: 2024-07-06 | Discharge: 2024-07-06 | Disposition: A | Discharge status: Home / Self Care | Source: Ambulatory Visit | Attending: Nurse Practitioner | Admitting: Nurse Practitioner

## 2024-07-06 DIAGNOSIS — E782 Mixed hyperlipidemia: Secondary | ICD-10-CM | POA: Insufficient documentation

## 2024-07-06 DIAGNOSIS — K449 Diaphragmatic hernia without obstruction or gangrene: Secondary | ICD-10-CM | POA: Diagnosis not present

## 2024-07-06 DIAGNOSIS — I700 Atherosclerosis of aorta: Secondary | ICD-10-CM | POA: Insufficient documentation

## 2024-07-06 DIAGNOSIS — I25118 Atherosclerotic heart disease of native coronary artery with other forms of angina pectoris: Secondary | ICD-10-CM | POA: Insufficient documentation

## 2024-07-06 DIAGNOSIS — Z955 Presence of coronary angioplasty implant and graft: Secondary | ICD-10-CM | POA: Insufficient documentation

## 2024-07-06 LAB — NM PET CT CARDIAC PERFUSION MULTI W/ABSOLUTE BLOODFLOW
Nuc Rest EF: 64 %
Nuc Stress EF: 73 %
Peak HR: 86 {beats}/min
Rest HR: 73 {beats}/min
Rest Nuclear Isotope Dose: 19.5 mCi
SRS: 1
SSS: 4
ST Depression (mm): 0 mm
Stress Nuclear Isotope Dose: 19.9 mCi
TID: 0.89

## 2024-07-06 MED ORDER — REGADENOSON 0.4 MG/5ML IV SOLN
0.4000 mg | Freq: Once | INTRAVENOUS | Status: AC
  Administered 2024-07-06: 0.4 mg via INTRAVENOUS
  Filled 2024-07-06: qty 5

## 2024-07-06 MED ORDER — RUBIDIUM RB82 GENERATOR (RUBYFILL)
25.0000 | PACK | Freq: Once | INTRAVENOUS | Status: AC
  Administered 2024-07-06: 19.88 via INTRAVENOUS

## 2024-07-06 MED ORDER — RUBIDIUM RB82 GENERATOR (RUBYFILL)
25.0000 | PACK | Freq: Once | INTRAVENOUS | Status: AC
  Administered 2024-07-06: 19.5 via INTRAVENOUS

## 2024-07-06 MED ORDER — REGADENOSON 0.4 MG/5ML IV SOLN
INTRAVENOUS | Status: AC
  Filled 2024-07-06: qty 5

## 2024-07-06 NOTE — Progress Notes (Signed)
 Patient presents for a cardiac PET stress test and tolerated procedure without incident. Patient maintained acceptable vital signs throughout the test and was offered caffeine  after test.  Patient ambulated out of department with a steady gait.

## 2024-07-07 DIAGNOSIS — R739 Hyperglycemia, unspecified: Secondary | ICD-10-CM | POA: Diagnosis not present

## 2024-07-07 DIAGNOSIS — F320 Major depressive disorder, single episode, mild: Secondary | ICD-10-CM | POA: Diagnosis not present

## 2024-07-07 DIAGNOSIS — I10 Essential (primary) hypertension: Secondary | ICD-10-CM | POA: Diagnosis not present

## 2024-07-07 DIAGNOSIS — C50012 Malignant neoplasm of nipple and areola, left female breast: Secondary | ICD-10-CM | POA: Diagnosis not present

## 2024-07-07 DIAGNOSIS — E782 Mixed hyperlipidemia: Secondary | ICD-10-CM | POA: Diagnosis not present

## 2024-07-07 DIAGNOSIS — I25119 Atherosclerotic heart disease of native coronary artery with unspecified angina pectoris: Secondary | ICD-10-CM | POA: Diagnosis not present

## 2024-07-07 DIAGNOSIS — D0512 Intraductal carcinoma in situ of left breast: Secondary | ICD-10-CM | POA: Diagnosis not present

## 2024-07-20 DIAGNOSIS — E876 Hypokalemia: Secondary | ICD-10-CM | POA: Diagnosis not present

## 2024-07-20 DIAGNOSIS — I25118 Atherosclerotic heart disease of native coronary artery with other forms of angina pectoris: Secondary | ICD-10-CM | POA: Diagnosis not present

## 2024-07-20 DIAGNOSIS — E782 Mixed hyperlipidemia: Secondary | ICD-10-CM | POA: Diagnosis not present

## 2024-07-20 DIAGNOSIS — Z955 Presence of coronary angioplasty implant and graft: Secondary | ICD-10-CM | POA: Diagnosis not present

## 2024-07-20 DIAGNOSIS — I10 Essential (primary) hypertension: Secondary | ICD-10-CM | POA: Diagnosis not present

## 2024-08-10 DIAGNOSIS — H0288A Meibomian gland dysfunction right eye, upper and lower eyelids: Secondary | ICD-10-CM | POA: Diagnosis not present

## 2024-08-10 DIAGNOSIS — Z961 Presence of intraocular lens: Secondary | ICD-10-CM | POA: Diagnosis not present

## 2024-08-10 DIAGNOSIS — H0288B Meibomian gland dysfunction left eye, upper and lower eyelids: Secondary | ICD-10-CM | POA: Diagnosis not present

## 2024-08-10 DIAGNOSIS — H5213 Myopia, bilateral: Secondary | ICD-10-CM | POA: Diagnosis not present

## 2024-08-21 DIAGNOSIS — Z955 Presence of coronary angioplasty implant and graft: Secondary | ICD-10-CM | POA: Diagnosis not present

## 2024-08-21 DIAGNOSIS — I10 Essential (primary) hypertension: Secondary | ICD-10-CM | POA: Diagnosis not present

## 2024-08-21 DIAGNOSIS — E782 Mixed hyperlipidemia: Secondary | ICD-10-CM | POA: Diagnosis not present

## 2024-08-21 DIAGNOSIS — I2510 Atherosclerotic heart disease of native coronary artery without angina pectoris: Secondary | ICD-10-CM | POA: Diagnosis not present

## 2024-10-05 NOTE — Progress Notes (Deleted)
 Symptom Management Clinic  [facility] at [facility] A Department of the [place]. [facility] [address] [place], [place] [address] [phone] (phone) [phone] (fax)  Patient Care Team: Sherial Bail, MD as PCP - General (Internal Medicine) Lenn Aran, MD as Consulting Physician (Radiation Oncology) Rodolph Romano, MD as Consulting Physician (General Surgery) Melanee Annah BROCKS, MD as Consulting Physician (Oncology)   Name of the [name]  [id number]  1951-07-10   Date of visit: 10/05/24  Diagnosis- DCIS  Chief complaint/ Reason for visit- Dyspareunia  Heme/Onc history:  Oncology History  Ductal carcinoma in situ (DCIS) of left breast  08/24/2022 Initial Diagnosis   Ductal carcinoma in situ (DCIS) of left breast   08/24/2022 Cancer Staging   Staging form: Breast, AJCC 8th Edition - Clinical stage from 08/24/2022: Stage 0 (cTis (DCIS), cN0, cM0, G2, ER+, PR: Not Assessed, HER2: Not Assessed) - Signed by Melanee Annah BROCKS, MD on 08/24/2022 Histologic grading system: 3 grade system     Interval history- Becky Mitchell is a 73 y.o. female with above history of DCIS, not on AI d/t weakly ER positivity and concern for cardiac risks, who returns to clinic for follow up for dyspareunia. She has run out of topical estrogen and couldn't refill it d/t insurance limitations. She is using coconut oil as moisturizer and lubricant which is working well. Using vaginal valium  but inserting approximately 6 hours prior to sexual activity. She has now been able to have intercourse twice and denies adverse effects. Feels good about her treatment plan.   Review of systems- Review of Systems  Constitutional:  Negative for malaise/fatigue and weight loss.  Gastrointestinal:  Negative for abdominal pain, blood in stool, constipation, diarrhea and melena.  Genitourinary:  Negative for dysuria, flank pain, frequency,  hematuria and urgency.       Denies vaginal bleeding, spotting, discharge.   Psychiatric/Behavioral:  Negative for depression. The patient is not nervous/anxious.      Allergies  Allergen Reactions   Fire Ant Anaphylaxis   Sulfa Antibiotics Shortness Of Breath and Rash   Bromfenac Other (See Comments)    MADE EYELIDS RED & EYES BLOODSHOT   Loteprednol Etabonate Other (See Comments)    MADE EYELIDS RED & EYES BLOODSHOT    Moxifloxacin Other (See Comments)    EYE DROPS---MADE EYELIDS RED & EYES BLOODSHOT    Latex Dermatitis   Tape Itching    Past Medical History:  Diagnosis Date   Allergies    Anginal pain    Arthritis    Asthma    Breast calcification, left    a.) Bx 05/20/2022 --> at least ADH, however features concerning for DCIS   Cataracts, bilateral    a.) s/p extraction 2020   Complication of anesthesia    a.) delayed emergence following routine colonoscopy   Coronary artery disease 04/20/2017   a.) LHC/PCI [facility] 04/20/2017 --> 85% pRCA, 85% pOM --> 2.5 x 12 mm Promus Primere DES to pRCA and 3.0 x 20 mm Promus Primere DES to pLCx; b.) LHC 11/06/2021: EF 55-65%, 25% p-mLM, 40% o-pLAD, 30-50% p-mLAD --> med mgmt.   Dyspareunia in female    Family history of adverse reaction to anesthesia    a.) delayed emergence in first degree relative (mother)   Fatty liver    Heart murmur    Hyperlipidemia    Hypertension     Past Surgical History:  Procedure Laterality Date  ABDOMINAL HYSTERECTOMY  1975   BREAST BIOPSY Left 04/27/2022   stereo bx/ x clip/ extremely scant and scattered detached groups of ductal epithelial cells in a background of adipose tissue   BREAST BIOPSY Left 05/20/2022   stereo bx, calcs, COIL clip-atypical ductal hyperplasia with associated calcifications.  Background mammary parenchyma with prior biopsy site change, fat necrosis, and focal lobular neoplasia.  Negative for invasive carcinoma.  Concerning for DCIS.   BREAST BIOPSY Left  06/12/2022   Procedure: BREAST BIOPSY WITH NEEDLE LOCALIZATION;  Surgeon: Dessa Reyes ORN, MD;  Location: [facility] ORS;  Service: General;  Laterality: Left;   BREAST LUMPECTOMY Left 2001   CATARACT EXTRACTION Bilateral 2020   CORONARY ANGIOPLASTY WITH STENT PLACEMENT Left 04/20/2017   Procedure: CORONARY ANGIOPLASTY WITH STENT PLACEMENT; Location: [facility]; Surgeon: Prentice Curry, MD   FOOT SURGERY Left    LEFT HEART CATH AND CORONARY ANGIOGRAPHY N/A 11/06/2021   Procedure: LEFT HEART CATH AND CORONARY ANGIOGRAPHY;  Surgeon: Florencio Cara BIRCH, MD;  Location: [facility] INVASIVE CV LAB;  Service: Cardiovascular;  Laterality: N/A;   spine cyst aspiration  10/28/2017    Social History   Socioeconomic History   Marital status: Married    Spouse name: Ubaldo   Number of children: 1   Years of education: Not on file   Highest education level: Not on file  Occupational History   Not on file  Tobacco Use   Smoking status: Never    Passive exposure: Past (ex spouse smoked in home 28 years)   Smokeless tobacco: Never  Vaping Use   Vaping status: Never Used  Substance and Sexual Activity   Alcohol use: Yes    Alcohol/week: 5.0 standard drinks of alcohol    Types: 5 Glasses of wine per week    Comment: wine-3-4 times a week   Drug use: Never   Sexual activity: Not on file  Other Topics Concern   Not on file  Social History Narrative   Lives at home with spouse Ubaldo    Social Drivers of Health   Financial Resource Strain: Low Risk  (02/03/2024)   Received [facility]   Overall Financial Resource Strain (CARDIA)    Difficulty of Paying Living Expenses: Not very hard  Food Insecurity: No Food Insecurity (02/03/2024)   Received from [facility]   Hunger Vital Sign    Within the past 12 months, you worried that your food would run out before you got the money to buy more.: Never true    Within the past 12 months, the food you bought just  didn't last and you didn't have money to get more.: Never true  Transportation Needs: No Transportation Needs (02/03/2024)   Received from [facility] - Transportation    In the past 12 months, has lack of transportation kept you from medical appointments or from getting medications?: No    Lack of Transportation (Non-Medical): No  Physical Activity: Not on file  Stress: Not on file  Social Connections: Unknown (01/13/2023)   Received from [facility]   Social Network    Social Network: Not on file  Intimate Partner Violence: Unknown (01/13/2023)   Received from [facility]   HITS    Physically Hurt: Not on file    Insult or Talk Down To: Not on file    Threaten Physical Harm: Not on file    Scream or Curse: Not on file  Family History  Problem Relation Age of Onset   Hypertension Mother    Skin cancer Mother    Kidney disease Mother    Gout Mother    Hypertension Father    Heart failure Father    Diabetes Sister    Fibromyalgia Sister    Arrhythmia Sister    Hypertension Brother    Prostate cancer Brother        dx 39s   Lung cancer Brother    Pancreatic cancer Paternal Aunt    Lung cancer Paternal Uncle    Brain cancer Maternal Grandmother 61   Breast cancer Cousin        dx 30s   Breast cancer Other     Current Outpatient Medications:    albuterol (VENTOLIN HFA) 108 (90 Base) MCG/ACT inhaler, Inhale 1-2 puffs into the lungs every 6 (six) hours as needed for wheezing or shortness of breath (HAS NOT USED SINCE 2021). (Patient not taking: Reported on 09/21/2022), Disp: , Rfl:    Ascorbic Acid (VITAMIN C) 1000 MG tablet, Take 1,000 mg by mouth daily., Disp: , Rfl:    aspirin  EC 81 MG tablet, Take 81 mg by mouth every evening. Swallow whole., Disp: , Rfl:    Calcium Carbonate (CALCIUM 600 PO), Take 1 tablet by mouth in the morning. (Patient not taking: Reported on 04/21/2024), Disp: , Rfl:    Cholecalciferol (VITAMIN D3 PO), Take 1 tablet by  mouth in the morning., Disp: , Rfl:    coconut oil OIL, Apply 1 application topically every other day. AT NIGHT (Applied to feet) (Patient not taking: Reported on 04/21/2024), Disp: , Rfl:    Coenzyme Q10-Vitamin E (COQ10 ST-100 PO), Take 100 mg by mouth every evening., Disp: , Rfl:    diazepam  (VALIUM ) 5 MG tablet, Apply few drops of water  to tablet then insert high into vagina approximately 45 minutes prior to dilator exercise or intercourse., Disp: 30 tablet, Rfl: 0   EPINEPHrine  0.3 mg/0.3 mL IJ SOAJ injection, Inject 0.3 mg into the muscle as needed for anaphylaxis., Disp: , Rfl:    estradiol  (ESTRACE ) 0.1 MG/GM vaginal cream, Place 0.25 Applicatorfuls vaginally 3 (three) times a week. Insert high into vagina at night 3 times a week., Disp: 42.5 g, Rfl: 2   ezetimibe (ZETIA) 10 MG tablet, Take 10 mg by mouth in the morning., Disp: , Rfl:    losartan (COZAAR) 50 MG tablet, Take 50 mg by mouth in the morning., Disp: , Rfl:    metoprolol succinate (TOPROL-XL) 50 MG 24 hr tablet, Take 50 mg by mouth every evening. Take with or immediately following a meal., Disp: , Rfl:    potassium chloride (KLOR-CON M) 10 MEQ tablet, Take by mouth., Disp: , Rfl:    potassium citrate (UROCIT-K) 10 MEQ (1080 MG) SR tablet, Take 10 mEq by mouth in the morning. (Patient not taking: Reported on 02/15/2024), Disp: , Rfl:    rosuvastatin (CRESTOR) 40 MG tablet, Take 40 mg by mouth at bedtime., Disp: , Rfl:    stavudine (ZERIT) 20 MG capsule, Take 20 mg by mouth daily., Disp: , Rfl:   Physical exam:  There were no vitals filed for this visit.  Physical Exam Vitals reviewed.  Constitutional:      Appearance: She is not ill-appearing.  Pulmonary:     Effort: Pulmonary effort is normal.  Abdominal:     General: There is no distension.  Neurological:     Mental Status: She is alert and oriented to  person, place, and time.  Psychiatric:        Mood and Affect: Mood normal.        Behavior: Behavior normal.      Assessment and plan- Patient is a 73 y.o. female   Dyspareunia- likely secondary to muscle spasms in setting of reduced elasticity of post operative scar tissue and hypoestrogen state due to menopause. Chronic. Refractory to pelvic floor PT, dilators, moisturizers. Improved with oral HRT but now discontinued/contraindicated d/t DCIS diagnosis. Previously discussed that low systemic absorption of estrogen and weakly ER positive dcis history likely is low risk. She elected to proceed with topical estrogen. She is using topical estrogen with improvement but had to discontinue d/t running out of cream/insurance limitation. Refilled. Plan for 0.25 applicatorful 3 times a week. Refilled. She is using vaginal valium  5 mg prior to dilator and/sexual activity with good results. She's now been able to have intercourse twice. Feels that she is having good results to treatment plan. Plan to continue. Could also consider ref to uro-gyn for injection of botulinum toxin to affected muscles if refractory symptoms. She will continue vaginal moisturizers and lubricants as well- preferring coconut oil which is working well. I also suspect component of anxiety contributing d/t her long history of ongoing trauma and pain. Could consider systemic anxiolytics in the future.   Disposition:  Rtc in 6 mo for follow up- la   Visit Diagnosis No diagnosis found.  Patient expressed understanding and was in agreement with this plan. She also understands that She can call clinic at any time with any questions, concerns, or complaints.   Thank you for allowing me to participate in the care of this very pleasant patient.   Tinnie Dawn, DNP, AGNP-C, AOCNP [facility] at [facility] [phone]  CC: Dr Melanee

## 2024-10-06 ENCOUNTER — Inpatient Hospital Stay: Payer: Medicare Other | Attending: Oncology | Admitting: Nurse Practitioner

## 2024-10-06 ENCOUNTER — Encounter: Payer: Self-pay | Admitting: Nurse Practitioner

## 2024-10-06 VITALS — BP 144/76 | HR 68 | Temp 98.2°F | Ht 62.5 in | Wt 162.2 lb

## 2024-10-06 DIAGNOSIS — Z801 Family history of malignant neoplasm of trachea, bronchus and lung: Secondary | ICD-10-CM | POA: Insufficient documentation

## 2024-10-06 DIAGNOSIS — D0512 Intraductal carcinoma in situ of left breast: Secondary | ICD-10-CM | POA: Diagnosis not present

## 2024-10-06 DIAGNOSIS — Z800 Family history of malignant neoplasm of digestive organs: Secondary | ICD-10-CM | POA: Diagnosis not present

## 2024-10-06 DIAGNOSIS — Z808 Family history of malignant neoplasm of other organs or systems: Secondary | ICD-10-CM | POA: Insufficient documentation

## 2024-10-06 DIAGNOSIS — Z803 Family history of malignant neoplasm of breast: Secondary | ICD-10-CM | POA: Insufficient documentation

## 2024-10-06 MED ORDER — DIAZEPAM 5 MG PO TABS: ORAL_TABLET | ORAL | 0 refills | Status: AC

## 2024-10-06 NOTE — Progress Notes (Signed)
 Hematology/Oncology Consult note [facility]  Telephone:([phone] [phone]  Patient Care Team: Sherial Bail, MD as PCP - General (Internal Medicine) Lenn Aran, MD as Consulting Physician (Radiation Oncology) Rodolph Romano, MD as Consulting Physician (General Surgery) Melanee Annah BROCKS, MD as Consulting Physician (Oncology)   Name of the [name]  [id number]  12/06/1950   Date of visit: 10/06/24  Diagnosis-right breast DCIS ER positive  Chief complaint/ Reason for visit-routine follow-up of DCIS  Heme/Onc history: Patient is a 73 year old female who had a routine screening mammogram in May 2023 which showed possible asymmetry in the right breast and possible microcalcifications in the left breast.  This was followed by a diagnostic mammogram which showed a 4 mm area of calcifications in the left breast.  Right breast asymmetry was thought to be apposition of normal breast tissue.  Patient had a biopsy of the calcifications which came back positive for at least atypical ductal hyperplasia.  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  ER 11 to 50% positive.  0.7 mm was the closest posterior margin.  Patient was seen by radiation oncology and underwent adjuvant radiation therapy but did not go to reexcision.     Risks and benefits of adjuvant endocrine therapy were discussed with the patient.  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  Interval history-patient is seen today for annual follow-up for DCIS of the right breast.  Appears to be doing well.  No concerning findings today.  External breast exam completed and negative for any concerning findings.  She denies any pain.  She had  also seen the morning and symptom management clinic with complaints of vaginal dryness and dyspareunia.  However she reports that this has resolved as well with vaginal Valium , coconut oil and vaginal estrogen.    ECOG PS- 0 Pain scale- 0   Review of systems- Review of Systems  Constitutional:  Negative for chills, fever, malaise/fatigue and weight loss.  HENT:  Negative for congestion, ear discharge and nosebleeds.   Eyes:  Negative for blurred vision.  Respiratory:  Negative for cough, hemoptysis, sputum production, shortness of breath and wheezing.   Cardiovascular:  Negative for chest pain, palpitations, orthopnea and claudication.  Gastrointestinal:  Negative for abdominal pain, blood in stool, constipation, diarrhea, heartburn, melena, nausea and vomiting.  Genitourinary:  Negative for dysuria, flank pain, frequency, hematuria and urgency.  Musculoskeletal:  Negative for back pain, joint pain and myalgias.  Skin:  Negative for rash.  Neurological:  Negative for dizziness, tingling, focal weakness, seizures, weakness and headaches.  Endo/Heme/Allergies:  Does not bruise/bleed easily.  Psychiatric/Behavioral:  Negative for depression and suicidal ideas. The patient does not have insomnia.       Allergies  Allergen Reactions   Fire Ant Anaphylaxis   Sulfa Antibiotics Rash, Shortness Of Breath and Other (See Comments)    Patient doesn't know but states its been since I was a child.   Bromfenac Other (See Comments)    MADE EYELIDS RED & EYES BLOODSHOT   Loteprednol Etabonate Other (See Comments)    MADE EYELIDS RED & EYES BLOODSHOT    Moxifloxacin Other (See Comments)    EYE DROPS---MADE EYELIDS RED & EYES BLOODSHOT    Latex Dermatitis  Other Diarrhea    Artificial Sweeteners   Tape Itching     Past Medical History:  Diagnosis Date   Allergies    Anginal pain    Arthritis    Asthma    Breast calcification, left    a.) Bx 05/20/2022 --> at least ADH, however  features concerning for DCIS   Cataracts, bilateral    a.) s/p extraction 2020   Complication of anesthesia    a.) delayed emergence following routine colonoscopy   Coronary artery disease 04/20/2017   a.) LHC/PCI [facility] 04/20/2017 --> 85% pRCA, 85% pOM --> 2.5 x 12 mm Promus Primere DES to pRCA and 3.0 x 20 mm Promus Primere DES to pLCx; b.) LHC 11/06/2021: EF 55-65%, 25% p-mLM, 40% o-pLAD, 30-50% p-mLAD --> med mgmt.   Dyspareunia in female    Family history of adverse reaction to anesthesia    a.) delayed emergence in first degree relative (mother)   Fatty liver    Heart murmur    Hyperlipidemia    Hypertension      Past Surgical History:  Procedure Laterality Date   ABDOMINAL HYSTERECTOMY  1975   BREAST BIOPSY Left 04/27/2022   stereo bx/ x clip/ extremely scant and scattered detached groups of ductal epithelial cells in a background of adipose tissue   BREAST BIOPSY Left 05/20/2022   stereo bx, calcs, COIL clip-atypical ductal hyperplasia with associated calcifications.  Background mammary parenchyma with prior biopsy site change, fat necrosis, and focal lobular neoplasia.  Negative for invasive carcinoma.  Concerning for DCIS.   BREAST BIOPSY Left 06/12/2022   Procedure: BREAST BIOPSY WITH NEEDLE LOCALIZATION;  Surgeon: Dessa Reyes ORN, MD;  Location: [facility] ORS;  Service: General;  Laterality: Left;   BREAST LUMPECTOMY Left 2001   CATARACT EXTRACTION Bilateral 2020   CORONARY ANGIOPLASTY WITH STENT PLACEMENT Left 04/20/2017   Procedure: CORONARY ANGIOPLASTY WITH STENT PLACEMENT; Location: [facility]; Surgeon: Prentice Curry, MD   FOOT SURGERY Left    LEFT HEART CATH AND CORONARY ANGIOGRAPHY N/A 11/06/2021   Procedure: LEFT HEART CATH AND CORONARY ANGIOGRAPHY;  Surgeon: Florencio Cara BIRCH, MD;  Location: [facility] INVASIVE CV LAB;  Service: Cardiovascular;  Laterality: N/A;   spine cyst aspiration  10/28/2017    Social History   Socioeconomic History    Marital status: Married    Spouse name: Ubaldo   Number of children: 1   Years of education: Not on file   Highest education level: Not on file  Occupational History   Not on file  Tobacco Use   Smoking status: Never    Passive exposure: Past (ex spouse smoked in home 28 years)   Smokeless tobacco: Never  Vaping Use   Vaping status: Never Used  Substance and Sexual Activity   Alcohol use: Yes    Alcohol/week: 5.0 standard drinks of alcohol    Types: 5 Glasses of wine per week    Comment: wine-3-4 times a week   Drug use: Never   Sexual activity: Not on file  Other Topics Concern   Not on file  Social History Narrative   Lives at home with spouse Ubaldo    Social Drivers of Health   Financial Resource Strain: Low Risk  (02/03/2024)   Received from [facility]   Overall Financial Resource Strain (CARDIA)    Difficulty of Paying Living Expenses: Not very hard  Food Insecurity: No Food Insecurity (02/03/2024)   Received from [facility]  Hunger Vital Sign    Within the past 12 months, you worried that your food would run out before you got the money to buy more.: Never true    Within the past 12 months, the food you bought just didn't last and you didn't have money to get more.: Never true  Transportation Needs: No Transportation Needs (02/03/2024)   Received from [facility] - Transportation    In the past 12 months, has lack of transportation kept you from medical appointments or from getting medications?: No    Lack of Transportation (Non-Medical): No  Physical Activity: Not on file  Stress: Not on file  Social Connections: Unknown (01/13/2023)   Received from [facility]   Social Network    Social Network: Not on file  Intimate Partner Violence: Unknown (01/13/2023)   Received from [facility]   HITS    Physically Hurt: Not on file    Insult or Talk Down To: Not on file    Threaten Physical Harm: Not on  file    Scream or Curse: Not on file    Family History  Problem Relation Age of Onset   Hypertension Mother    Skin cancer Mother    Kidney disease Mother    Gout Mother    Hypertension Father    Heart failure Father    Diabetes Sister    Fibromyalgia Sister    Arrhythmia Sister    Hypertension Brother    Prostate cancer Brother        dx 76s   Lung cancer Brother    Pancreatic cancer Paternal Aunt    Lung cancer Paternal Uncle    Brain cancer Maternal Grandmother 66   Breast cancer Cousin        dx 54s   Breast cancer Other      Current Outpatient Medications:    albuterol (VENTOLIN HFA) 108 (90 Base) MCG/ACT inhaler, Inhale 1-2 puffs into the lungs every 6 (six) hours as needed for wheezing or shortness of breath (HAS NOT USED SINCE 2021)., Disp: , Rfl:    aspirin  EC 81 MG tablet, Take 81 mg by mouth every evening. Swallow whole., Disp: , Rfl:    Cholecalciferol (VITAMIN D3 PO), Take 1 tablet by mouth in the morning., Disp: , Rfl:    coconut oil OIL, Apply 1 application topically every other day. AT NIGHT (Applied to feet), Disp: , Rfl:    Coenzyme Q10-Vitamin E (COQ10 ST-100 PO), Take 100 mg by mouth every evening., Disp: , Rfl:    EPINEPHrine  0.3 mg/0.3 mL IJ SOAJ injection, Inject 0.3 mg into the muscle as needed for anaphylaxis., Disp: , Rfl:    estradiol  (ESTRACE ) 0.1 MG/GM vaginal cream, Place 0.25 Applicatorfuls vaginally 3 (three) times a week. Insert high into vagina at night 3 times a week., Disp: 42.5 g, Rfl: 2   ezetimibe (ZETIA) 10 MG tablet, Take 10 mg by mouth in the morning., Disp: , Rfl:    isosorbide mononitrate (IMDUR) 30 MG 24 hr tablet, Take 30 mg by mouth daily., Disp: , Rfl:    losartan (COZAAR) 50 MG tablet, Take 50 mg by mouth in the morning., Disp: , Rfl:    metoprolol succinate (TOPROL-XL) 50 MG 24 hr tablet, Take 50 mg by mouth every evening. Take with or immediately following a meal., Disp: , Rfl:    potassium chloride (KLOR-CON M) 10 MEQ tablet,  Take by mouth., Disp: , Rfl:    rosuvastatin (  CRESTOR) 40 MG tablet, Take 40 mg by mouth at bedtime., Disp: , Rfl:    Ascorbic Acid (VITAMIN C) 1000 MG tablet, Take 1,000 mg by mouth daily. (Patient not taking: Reported on 10/06/2024), Disp: , Rfl:    diazepam  (VALIUM ) 5 MG tablet, Apply few drops of water  to tablet then insert high into vagina approximately 45 minutes prior to dilator exercise or intercourse., Disp: 30 tablet, Rfl: 0  Physical exam:  Vitals:   10/06/24 1106 10/06/24 1126  BP: (!) 149/87 (!) 144/76  Pulse: 68   Temp: 98.2 F (36.8 C)   TempSrc: Oral   SpO2: 100%   Weight: 162 lb 3.2 oz (73.6 kg)   Height: 5' 2.5 (1.588 m)    Physical Exam Cardiovascular:     Rate and Rhythm: Normal rate and regular rhythm.     Heart sounds: Normal heart sounds.  Pulmonary:     Effort: Pulmonary effort is normal.     Breath sounds: Normal breath sounds.  Skin:    General: Skin is warm and dry.  Neurological:     Mental Status: She is alert and oriented to person, place, and time.         Latest Ref Rng & Units 07/22/2022   12:43 PM  CMP  Glucose 70 - 99 mg/dL 898   BUN 8 - 23 mg/dL 18   Creatinine 9.55 - 1.00 mg/dL 9.15   Sodium 864 - 854 mmol/L 141   Potassium 3.5 - 5.1 mmol/L 3.7   Chloride 98 - 111 mmol/L 107   CO2 22 - 32 mmol/L 25   Calcium 8.9 - 10.3 mg/dL 9.6   Total Protein 6.5 - 8.1 g/dL 7.1   Total Bilirubin 0.3 - 1.2 mg/dL 1.1   Alkaline Phos 38 - 126 U/L 128   AST 15 - 41 U/L 39   ALT 0 - 44 U/L 29       Latest Ref Rng & Units 08/13/2022   11:46 AM  CBC  WBC 4.0 - 10.5 K/uL 4.3   Hemoglobin 12.0 - 15.0 g/dL 86.0   Hematocrit 63.9 - 46.0 % 39.1   Platelets 150 - 400 K/uL 104      Assessment and plan- Patient is a 73 y.o. female with weakly ER positive left breast DCIS status postlumpectomy and adjuvant radiation therapy.  She is not on any endocrine therapy.  This is a routine follow-up visit  Clinically patient is doing well with no concerning  signs and symptoms of recurrence based on today's exam.  Her mammogram from May 2025 was unremarkable, ordered next mammogram for 03/2025.  We will see her back in 1 year no labs given that she is not on any endocrine therapy.    As for dyspareunia this is now well controlled with vaginal valium , topical estrogen and coconut oil.  Refilled vaginal valium  this visit.     Morna Husband AGNP-C [facility] [phone] 10/06/2024 3:08 PM

## 2024-10-09 ENCOUNTER — Other Ambulatory Visit: Payer: Self-pay | Admitting: Nurse Practitioner

## 2024-10-09 DIAGNOSIS — D0512 Intraductal carcinoma in situ of left breast: Secondary | ICD-10-CM

## 2024-10-09 DIAGNOSIS — Z23 Encounter for immunization: Secondary | ICD-10-CM | POA: Diagnosis not present

## 2024-10-23 ENCOUNTER — Ambulatory Visit: Admitting: Nurse Practitioner

## 2024-10-24 ENCOUNTER — Ambulatory Visit: Admitting: Nurse Practitioner

## 2025-03-27 ENCOUNTER — Encounter

## 2025-03-27 ENCOUNTER — Other Ambulatory Visit

## 2025-10-05 ENCOUNTER — Inpatient Hospital Stay: Admitting: Oncology
